# Patient Record
Sex: Male | Born: 1965 | Race: Black or African American | Hispanic: No | Marital: Single | Smoking: Current every day smoker
Health system: Southern US, Community
[De-identification: ages and names within clinical notes are randomized; demographics above are authoritative.]

## PROBLEM LIST (undated history)

## (undated) DIAGNOSIS — M75101 Unspecified rotator cuff tear or rupture of right shoulder, not specified as traumatic: Secondary | ICD-10-CM

## (undated) DIAGNOSIS — F32A Depression, unspecified: Secondary | ICD-10-CM

## (undated) DIAGNOSIS — F319 Bipolar disorder, unspecified: Secondary | ICD-10-CM

## (undated) DIAGNOSIS — I10 Essential (primary) hypertension: Secondary | ICD-10-CM

## (undated) DIAGNOSIS — F329 Major depressive disorder, single episode, unspecified: Secondary | ICD-10-CM

## (undated) DIAGNOSIS — M1990 Unspecified osteoarthritis, unspecified site: Secondary | ICD-10-CM

## (undated) DIAGNOSIS — K219 Gastro-esophageal reflux disease without esophagitis: Secondary | ICD-10-CM

## (undated) DIAGNOSIS — D573 Sickle-cell trait: Secondary | ICD-10-CM

## (undated) DIAGNOSIS — E785 Hyperlipidemia, unspecified: Secondary | ICD-10-CM

## (undated) DIAGNOSIS — J302 Other seasonal allergic rhinitis: Secondary | ICD-10-CM

## (undated) DIAGNOSIS — Z9889 Other specified postprocedural states: Secondary | ICD-10-CM

## (undated) DIAGNOSIS — G5603 Carpal tunnel syndrome, bilateral upper limbs: Secondary | ICD-10-CM

## (undated) DIAGNOSIS — R7303 Prediabetes: Secondary | ICD-10-CM

## (undated) DIAGNOSIS — J45909 Unspecified asthma, uncomplicated: Secondary | ICD-10-CM

## (undated) DIAGNOSIS — Z973 Presence of spectacles and contact lenses: Secondary | ICD-10-CM

## (undated) DIAGNOSIS — Z87898 Personal history of other specified conditions: Secondary | ICD-10-CM

## (undated) HISTORY — PX: TOOTH EXTRACTION: SUR596

## (undated) HISTORY — DX: Depression, unspecified: F32.A

## (undated) HISTORY — DX: Gastro-esophageal reflux disease without esophagitis: K21.9

## (undated) HISTORY — DX: Unspecified osteoarthritis, unspecified site: M19.90

## (undated) HISTORY — DX: Bipolar disorder, unspecified: F31.9

## (undated) HISTORY — DX: Sickle-cell trait: D57.3

## (undated) HISTORY — DX: Major depressive disorder, single episode, unspecified: F32.9

## (undated) HISTORY — DX: Other seasonal allergic rhinitis: J30.2

## (undated) HISTORY — DX: Hyperlipidemia, unspecified: E78.5

---

## 2003-12-25 HISTORY — PX: APPENDECTOMY: SHX54

## 2004-12-24 HISTORY — PX: CEREBRAL ANEURYSM REPAIR: SHX164

## 2007-05-19 ENCOUNTER — Emergency Department: Payer: Self-pay | Admitting: General Practice

## 2007-05-19 ENCOUNTER — Other Ambulatory Visit: Payer: Self-pay

## 2007-10-07 ENCOUNTER — Other Ambulatory Visit: Payer: Self-pay

## 2007-10-07 ENCOUNTER — Emergency Department: Payer: Self-pay | Admitting: Emergency Medicine

## 2008-05-31 ENCOUNTER — Emergency Department: Payer: Self-pay | Admitting: Emergency Medicine

## 2008-09-28 ENCOUNTER — Emergency Department: Payer: Self-pay | Admitting: Emergency Medicine

## 2008-09-28 ENCOUNTER — Other Ambulatory Visit: Payer: Self-pay

## 2009-11-01 ENCOUNTER — Ambulatory Visit: Payer: Self-pay | Admitting: Neurology

## 2011-12-25 HISTORY — PX: KNEE ARTHROSCOPY: SUR90

## 2011-12-25 HISTORY — PX: IRRIGATION AND DEBRIDEMENT ABSCESS: SHX5252

## 2013-07-15 ENCOUNTER — Emergency Department (INDEPENDENT_AMBULATORY_CARE_PROVIDER_SITE_OTHER)
Admission: EM | Admit: 2013-07-15 | Discharge: 2013-07-15 | Disposition: A | Discharge status: Home / Self Care | Payer: PRIVATE HEALTH INSURANCE | Source: Home / Self Care | Attending: Emergency Medicine | Admitting: Emergency Medicine

## 2013-07-15 ENCOUNTER — Encounter (HOSPITAL_COMMUNITY): Payer: Self-pay | Admitting: Emergency Medicine

## 2013-07-15 DIAGNOSIS — G5600 Carpal tunnel syndrome, unspecified upper limb: Secondary | ICD-10-CM

## 2013-07-15 DIAGNOSIS — M25569 Pain in unspecified knee: Secondary | ICD-10-CM

## 2013-07-15 DIAGNOSIS — L0390 Cellulitis, unspecified: Secondary | ICD-10-CM

## 2013-07-15 DIAGNOSIS — M25562 Pain in left knee: Secondary | ICD-10-CM

## 2013-07-15 DIAGNOSIS — L0291 Cutaneous abscess, unspecified: Secondary | ICD-10-CM

## 2013-07-15 HISTORY — DX: Unspecified asthma, uncomplicated: J45.909

## 2013-07-15 HISTORY — DX: Essential (primary) hypertension: I10

## 2013-07-15 MED ORDER — KETOROLAC TROMETHAMINE 60 MG/2ML IM SOLN
60.0000 mg | Freq: Once | INTRAMUSCULAR | Status: AC
  Administered 2013-07-15: 60 mg via INTRAMUSCULAR

## 2013-07-15 MED ORDER — KETOCONAZOLE 2 % EX CREA: TOPICAL_CREAM | Freq: Every day | CUTANEOUS | Status: DC

## 2013-07-15 MED ORDER — MUPIROCIN 2 % EX OINT: TOPICAL_OINTMENT | Freq: Three times a day (TID) | CUTANEOUS | Status: DC

## 2013-07-15 MED ORDER — KETOROLAC TROMETHAMINE 60 MG/2ML IM SOLN
INTRAMUSCULAR | Status: AC
  Filled 2013-07-15: qty 2

## 2013-07-15 MED ORDER — DOXYCYCLINE HYCLATE 100 MG PO TABS: 100.0000 mg | ORAL_TABLET | Freq: Two times a day (BID) | ORAL | Status: DC

## 2013-07-15 MED ORDER — GABAPENTIN 300 MG PO CAPS: ORAL_CAPSULE | ORAL | Status: DC

## 2013-07-15 NOTE — ED Provider Notes (Signed)
Chief Complaint:   Chief Complaint  Patient presents with  . Knee Pain    History of Present Illness:   Troy Elliott is a 47 year old male who presents today for multiple issues including left knee pain, carpal tunnel syndrome, and skin infections. The patient states his left knee pain has been going on since early this year. He underwent a surgical procedure on March 16 of this year in [place] in which a loose body was removed. It's not certain whether this was removed from the joint itself or from the bursa. The incision looks like it's over the bursa. Ever since then the knee has been painful, swollen, locks up, and sometimes gives way. He is wearing a knee brace today. Because of this pain and multiple other pains including his carpal tunnel syndrome and shoulder pain, he is in pain management in [place] and is on Percocet 5 days 325 3 per day. He comes in today requesting a refill on his Percocet since he is here in [place] taking care of a sick sister. He also has had a year-long history of carpal tunnel syndrome with pain and numbness and tingling in both hands. He was supposed to start injections in his wrists but because he came here to [facility] he did not get this done. He has wrist splints but he does not have them with him, having left them in [place]. He also has a long-standing history of a recurring skin abscesses on his legs and other areas of his body. He has been diagnosed with MRSA. As far as I can ascertain, he has never undergone a MRSA decontamination protocol. He denies any fever or chills. He has 2 lesions on his leg, one on the left lateral thigh and one near the buttock. He also complains of frequent headaches and has a history of tinea cruris. He would like something prescribed for his groin rash.   Review of Systems:  Other than noted above, the patient denies any of the following symptoms: Systemic:  No fevers, chills, sweats, or aches.  No fatigue or  tiredness. Musculoskeletal:  No joint pain, arthritis, bursitis, swelling, back pain, or neck pain.  Neurological:  No muscular weakness, paresthesias, headache, or trouble with speech or coordination.  No dizziness.  PMFSH:  Past medical history, family history, social history, meds, and allergies were reviewed.  No prior history of knee pain or arthritis.   he is allergic to Dilantin. He's on a multiplicity of medications including Cozaar, amitriptyline, 2 different diuretics, and antacid, meloxicam, and Cymbalta. He also has high blood pressure and asthma.   Physical Exam:   Vital signs:  BP 155/94  Pulse 90  Temp(Src) 98.3 F (36.8 C) (Oral)  Resp 20  SpO2 99% Gen:  Alert and oriented times 3.  In no distress. Musculoskeletal:  there is a midline scar over the left knee, this appears to be over the bursa itself and not involving the joint. This is well-healed. There is some swelling in the bursa which appears chronic. There is no redness, heat, or tenderness to palpation. The knee joint itself has a full range of motion with no pain.   McMurray's test was  negative.  Lachman's test was  negative.  Anterior drawer test was negative.   Varus and valgus stress  negative.  Otherwise, all joints had a full a ROM with no swelling, bruising or deformity.  No edema, pulses full. Extremities were warm and pink.  Capillary refill was brisk.  Exam  of the hands reveals pain to palpation over both carpal tunnels with positive Tinel and Phalen signs. Skin:  Clear, warm and dry.   He has areas of cellulitis on the left lateral thigh measuring 3 cm and near the buttock measuring 5 cm. Neuro:  Alert and oriented times 3.  Muscle strength was normal.  Sensation was intact to light touch.   Course in Urgent Care Center:    He was given Toradol 60 mg IM.  Assessment:  The primary encounter diagnosis was Knee pain, left. Diagnoses of Carpal tunnel syndrome, unspecified laterality and Cellulitis were also  pertinent to this visit.  I explained to him that we did not treat chronic pain with opioid pain medications here. I did give him a prescription for gabapentin for the pain and offered tramadol, but he declines. He needs a decontamination protocol for MRSA. I suggested twice weekly Clorox baths for 3 months and application of mupirocin ointment to the nostrils 3 times a day for one month. Finally, he was given a prescription for ketoconazole for the groin rash.  Plan:   1.  The following meds were prescribed:   New Prescriptions   DOXYCYCLINE (VIBRA-TABS) 100 MG TABLET    Take 1 tablet (100 mg total) by mouth 2 (two) times daily.   GABAPENTIN (NEURONTIN) 300 MG CAPSULE    1 daily for 3 days, 1 BID for 3 days, then 1 TID   KETOCONAZOLE (NIZORAL) 2 % CREAM    Apply topically daily.   MUPIROCIN OINTMENT (BACTROBAN) 2 %    Apply topically 3 (three) times daily.   2.  The patient was instructed in symptomatic care, including rest and activity, elevation, application of ice and compression.  Appropriate handouts were given. 3.  The patient was told to return if becoming worse in any way, if no better in 3 or 4 days, and given some red flag symptoms such as  worsening pain or fever  that would indicate earlier return.   4.  The patient was told to follow up with his primary care physician or orthopedist in [place].    Reuben Likes, MD 07/15/13 [phone]

## 2013-07-15 NOTE — ED Notes (Signed)
Pt c/o left knee pain onset 3 days... Denies inj/trauma... Hx of left knee surgery back in 3/14... Pain and swelling increases w/activity.Marland KitchenMarland KitchenKnee braces alleviates pain... Visiting from [place]; sees pain management clinic over there... Also c/o multiple abscess on left thing; tender and swelling... Denies drainage... He is alert w/no signs of acute distress.

## 2013-07-19 ENCOUNTER — Emergency Department (HOSPITAL_COMMUNITY)
Admission: EM | Admit: 2013-07-19 | Discharge: 2013-07-19 | Disposition: A | Discharge status: Home / Self Care | Payer: Medicaid - Out of State | Attending: Emergency Medicine | Admitting: Emergency Medicine

## 2013-07-19 ENCOUNTER — Encounter (HOSPITAL_COMMUNITY): Payer: Self-pay | Admitting: Emergency Medicine

## 2013-07-19 DIAGNOSIS — Z7982 Long term (current) use of aspirin: Secondary | ICD-10-CM | POA: Insufficient documentation

## 2013-07-19 DIAGNOSIS — Z791 Long term (current) use of non-steroidal anti-inflammatories (NSAID): Secondary | ICD-10-CM | POA: Insufficient documentation

## 2013-07-19 DIAGNOSIS — L03119 Cellulitis of unspecified part of limb: Secondary | ICD-10-CM | POA: Insufficient documentation

## 2013-07-19 DIAGNOSIS — F17200 Nicotine dependence, unspecified, uncomplicated: Secondary | ICD-10-CM | POA: Diagnosis not present

## 2013-07-19 DIAGNOSIS — L02419 Cutaneous abscess of limb, unspecified: Secondary | ICD-10-CM | POA: Diagnosis not present

## 2013-07-19 DIAGNOSIS — J45909 Unspecified asthma, uncomplicated: Secondary | ICD-10-CM | POA: Diagnosis not present

## 2013-07-19 DIAGNOSIS — L02416 Cutaneous abscess of left lower limb: Secondary | ICD-10-CM

## 2013-07-19 DIAGNOSIS — Z79899 Other long term (current) drug therapy: Secondary | ICD-10-CM | POA: Diagnosis not present

## 2013-07-19 DIAGNOSIS — I10 Essential (primary) hypertension: Secondary | ICD-10-CM | POA: Insufficient documentation

## 2013-07-19 MED ORDER — SULFAMETHOXAZOLE-TRIMETHOPRIM 800-160 MG PO TABS: 1.0000 | ORAL_TABLET | Freq: Two times a day (BID) | ORAL | Status: DC

## 2013-07-19 MED ORDER — OXYCODONE-ACETAMINOPHEN 5-325 MG PO TABS: 2.0000 | ORAL_TABLET | ORAL | Status: DC | PRN

## 2013-07-19 MED ORDER — OXYCODONE-ACETAMINOPHEN 5-325 MG PO TABS
2.0000 | ORAL_TABLET | Freq: Once | ORAL | Status: AC
  Administered 2013-07-19: 2 via ORAL
  Filled 2013-07-19: qty 2

## 2013-07-19 NOTE — ED Provider Notes (Signed)
This chart was scribed for non-physician practitioner Trisha Mangle, PA-C working with Derwood Kaplan, MD, by Candelaria Stagers, ED Scribe. This patient was seen in room TR09C/TR09C and the patient's care was started at 4:23 PM  CSN: [id number]     Arrival date & time 07/19/13  1332 History     First MD Initiated Contact with Patient 07/19/13 1619     Chief Complaint  Patient presents with  . Abscess    The history is provided by the patient. No language interpreter was used.   HPI Comments: Troy Elliott is a 47 y.o. male who presents to the Emergency Department complaining of mutliple abscess to the left leg and buttocks that started several days ago which are gradually worsening.  Pt reports he has h/o of abscesses with his last one three weeks ago to the buttocks.  Pt denies ill contacts.  He reports MRSA infection in the past.  Pt has finished the antibiotics for the last abscsess.     Past Medical History  Diagnosis Date  . Hypertension   . Asthma    Past Surgical History  Procedure Laterality Date  . Knee surgery Left    No family history on file. History  Substance Use Topics  . Smoking status: Current Every Day Smoker  . Smokeless tobacco: Not on file  . Alcohol Use: Yes    Review of Systems  Skin:       multiple abscesses to left thigh and buttocks.   All other systems reviewed and are negative.    Allergies  Dilantin  Home Medications   Current Outpatient Rx  Name  Route  Sig  Dispense  Refill  . Alum & Mag Hydroxide-Simeth (ANTACID & ANTIGAS PO)   Oral   Take 1 tablet by mouth 2 (two) times daily.         Marland Kitchen amLODipine (NORVASC) 5 MG tablet   Oral   Take 5 mg by mouth daily.         Marland Kitchen aspirin EC 81 MG tablet   Oral   Take 81 mg by mouth daily.         Marland Kitchen atorvastatin (LIPITOR) 10 MG tablet   Oral   Take 10 mg by mouth daily.         . DULoxetine (CYMBALTA) 30 MG capsule   Oral   Take 30 mg by mouth daily.         .  hydrochlorothiazide (HYDRODIURIL) 25 MG tablet   Oral   Take 25 mg by mouth daily.         Marland Kitchen losartan (COZAAR) 50 MG tablet   Oral   Take 50 mg by mouth 2 (two) times daily.         . meloxicam (MOBIC) 15 MG tablet   Oral   Take 15 mg by mouth daily.         Marland Kitchen oxyCODONE (OXY IR/ROXICODONE) 5 MG immediate release tablet   Oral   Take 5 mg by mouth 2 (two) times daily.         . ranitidine (ZANTAC) 150 MG tablet   Oral   Take 150 mg by mouth 2 (two) times daily.          BP 161/98  Pulse 74  Temp(Src) 98.3 F (36.8 C) (Oral)  SpO2 100% Physical Exam  Nursing note and vitals reviewed. Constitutional: He is oriented to person, place, and time. He appears well-developed and well-nourished. No distress.  HENT:  Head: Normocephalic and atraumatic.  Eyes: EOM are normal.  Neck: Neck supple. No tracheal deviation present.  Cardiovascular: Normal rate.   Pulmonary/Chest: Effort normal. No respiratory distress.  Musculoskeletal: Normal range of motion.  Neurological: He is alert and oriented to person, place, and time.  Skin: Skin is warm and dry.  Multiple raised red areas with center pustules on left leg, left thigh, and buttocks.   Psychiatric: He has a normal mood and affect. His behavior is normal.    ED Course   Procedures  DIAGNOSTIC STUDIES: Oxygen Saturation is 100% on room air, normal by my interpretation.    COORDINATION OF CARE:  4:25 PM Discussed course of care with pt which includes antibiotics and pain medication.  Pt understands and agrees.   Labs Reviewed - No data to display No results found. 1. Abscess of left leg     MDM   I personally performed the services in this documentation, which was scribed in my presence.  The recorded information has been reviewed and considered.   Barnet Pall.   Lonia Skinner Manti, PA-C 07/19/13 1659

## 2013-07-19 NOTE — ED Notes (Signed)
Pt. Stated, i have multiple abscesses on my legs.

## 2013-07-20 NOTE — ED Provider Notes (Signed)
Medical screening examination/treatment/procedure(s) were performed by non-physician practitioner and as supervising physician I was immediately available for consultation/collaboration.  Ezekiel Menzer, MD 07/20/13 1544 

## 2013-09-12 ENCOUNTER — Encounter (HOSPITAL_COMMUNITY): Payer: Self-pay | Admitting: Emergency Medicine

## 2013-09-12 ENCOUNTER — Emergency Department (HOSPITAL_COMMUNITY): Payer: Medicaid - Out of State

## 2013-09-12 ENCOUNTER — Emergency Department (HOSPITAL_COMMUNITY)
Admission: EM | Admit: 2013-09-12 | Discharge: 2013-09-12 | Disposition: A | Discharge status: Home / Self Care | Payer: Medicaid - Out of State | Attending: Emergency Medicine | Admitting: Emergency Medicine

## 2013-09-12 DIAGNOSIS — S62609A Fracture of unspecified phalanx of unspecified finger, initial encounter for closed fracture: Secondary | ICD-10-CM | POA: Insufficient documentation

## 2013-09-12 DIAGNOSIS — Y929 Unspecified place or not applicable: Secondary | ICD-10-CM | POA: Diagnosis not present

## 2013-09-12 DIAGNOSIS — Z7982 Long term (current) use of aspirin: Secondary | ICD-10-CM | POA: Insufficient documentation

## 2013-09-12 DIAGNOSIS — R509 Fever, unspecified: Secondary | ICD-10-CM | POA: Diagnosis not present

## 2013-09-12 DIAGNOSIS — S62502A Fracture of unspecified phalanx of left thumb, initial encounter for closed fracture: Secondary | ICD-10-CM

## 2013-09-12 DIAGNOSIS — F17200 Nicotine dependence, unspecified, uncomplicated: Secondary | ICD-10-CM | POA: Diagnosis not present

## 2013-09-12 DIAGNOSIS — W010XXA Fall on same level from slipping, tripping and stumbling without subsequent striking against object, initial encounter: Secondary | ICD-10-CM | POA: Diagnosis not present

## 2013-09-12 DIAGNOSIS — Z79899 Other long term (current) drug therapy: Secondary | ICD-10-CM | POA: Insufficient documentation

## 2013-09-12 DIAGNOSIS — N63 Unspecified lump in unspecified breast: Secondary | ICD-10-CM | POA: Diagnosis not present

## 2013-09-12 DIAGNOSIS — K044 Acute apical periodontitis of pulpal origin: Secondary | ICD-10-CM | POA: Insufficient documentation

## 2013-09-12 DIAGNOSIS — K047 Periapical abscess without sinus: Secondary | ICD-10-CM

## 2013-09-12 DIAGNOSIS — K089 Disorder of teeth and supporting structures, unspecified: Secondary | ICD-10-CM | POA: Diagnosis present

## 2013-09-12 DIAGNOSIS — Y9389 Activity, other specified: Secondary | ICD-10-CM | POA: Insufficient documentation

## 2013-09-12 DIAGNOSIS — I10 Essential (primary) hypertension: Secondary | ICD-10-CM | POA: Diagnosis not present

## 2013-09-12 DIAGNOSIS — J45909 Unspecified asthma, uncomplicated: Secondary | ICD-10-CM | POA: Insufficient documentation

## 2013-09-12 MED ORDER — PENICILLIN V POTASSIUM 500 MG PO TABS: 500.0000 mg | ORAL_TABLET | Freq: Three times a day (TID) | ORAL | Status: DC

## 2013-09-12 MED ORDER — IBUPROFEN 600 MG PO TABS: 600.0000 mg | ORAL_TABLET | Freq: Four times a day (QID) | ORAL | Status: DC | PRN

## 2013-09-12 MED ORDER — HYDROCODONE-ACETAMINOPHEN 5-325 MG PO TABS
1.0000 | ORAL_TABLET | Freq: Once | ORAL | Status: AC
  Administered 2013-09-12: 1 via ORAL
  Filled 2013-09-12: qty 1

## 2013-09-12 MED ORDER — HYDROCODONE-ACETAMINOPHEN 5-325 MG PO TABS: ORAL_TABLET | ORAL | Status: DC

## 2013-09-12 NOTE — ED Provider Notes (Signed)
CSN: [id number]     Arrival date & time 09/12/13  1331 History  This chart was scribed for non-physician practitioner Rhea Bleacher PA-C working with Roney Marion, MD by Leone Payor, ED Scribe. This patient was seen in room TR07C/TR07C and the patient's care was started at 1331.    Chief Complaint  Patient presents with  . Dental Pain  . Abscess    The history is provided by the patient. No language interpreter was used.    HPI Comments: Troy Elliott is a 47 y.o. male who presents to the Emergency Department complaining of 2 days of gradual onset, gradually worsening, constant right upper dental pain. Pt states he had associated subjective fever and chills at night. He denies trouble swallowing.   Pt also complains of a history of abscesses and has a constant, unchanged lump to the left chest that occurred a few weeks ago. He also complains of left thumb pain that began yesterday. Pt states he tripped causing him to break his fall with that hand.   Pt is from [place] and will return there in 1 week. He has a PCP there he can follow up with.   No treatments for these PTA. The onset of this condition was acute. The course is constant. Aggravating factors: none. Alleviating factors: none.    Past Medical History  Diagnosis Date  . Hypertension   . Asthma    Past Surgical History  Procedure Laterality Date  . Knee surgery Left    No family history on file. History  Substance Use Topics  . Smoking status: Current Every Day Smoker  . Smokeless tobacco: Not on file  . Alcohol Use: Yes    Review of Systems  Constitutional: Positive for fever and chills. Negative for activity change.  HENT: Positive for dental problem. Negative for ear pain, sore throat, facial swelling, trouble swallowing and neck pain.   Eyes: Negative for discharge.  Respiratory: Negative for shortness of breath and stridor.   Gastrointestinal: Negative for rectal pain.  Genitourinary: Negative for dysuria.   Musculoskeletal: Positive for arthralgias (left thumb pain). Negative for back pain, joint swelling and gait problem.  Skin: Negative for color change, rash and wound.       Positive for skin lump  Neurological: Negative for weakness, numbness and headaches.  Hematological: Negative for adenopathy.    Allergies  Dilantin  Home Medications   Current Outpatient Rx  Name  Route  Sig  Dispense  Refill  . Alum & Mag Hydroxide-Simeth (ANTACID & ANTIGAS PO)   Oral   Take 1 tablet by mouth 2 (two) times daily.         Marland Kitchen amLODipine (NORVASC) 5 MG tablet   Oral   Take 5 mg by mouth daily.         Marland Kitchen aspirin EC 81 MG tablet   Oral   Take 81 mg by mouth daily.         Marland Kitchen atorvastatin (LIPITOR) 10 MG tablet   Oral   Take 10 mg by mouth daily.         . DULoxetine (CYMBALTA) 30 MG capsule   Oral   Take 30 mg by mouth daily.         . hydrochlorothiazide (HYDRODIURIL) 25 MG tablet   Oral   Take 25 mg by mouth daily.         Marland Kitchen losartan (COZAAR) 50 MG tablet   Oral   Take 50 mg by mouth  2 (two) times daily.         . meloxicam (MOBIC) 15 MG tablet   Oral   Take 15 mg by mouth daily.         Marland Kitchen oxyCODONE (OXY IR/ROXICODONE) 5 MG immediate release tablet   Oral   Take 5 mg by mouth 2 (two) times daily.         Marland Kitchen oxyCODONE-acetaminophen (PERCOCET/ROXICET) 5-325 MG per tablet   Oral   Take 2 tablets by mouth every 4 (four) hours as needed for pain.   6 tablet   0   . ranitidine (ZANTAC) 150 MG tablet   Oral   Take 150 mg by mouth 2 (two) times daily.         Marland Kitchen sulfamethoxazole-trimethoprim (SEPTRA DS) 800-160 MG per tablet   Oral   Take 1 tablet by mouth 2 (two) times daily.   28 tablet   0    BP 161/101  Pulse 82  Temp(Src) 99.3 F (37.4 C) (Oral)  Resp 16  SpO2 98% Physical Exam  Nursing note and vitals reviewed. Constitutional: He is oriented to person, place, and time. He appears well-developed and well-nourished. No distress.  HENT:   Head: Macrocephalic.  Mouth/Throat: Uvula is midline, oropharynx is clear and moist and mucous membranes are normal.  Right 2nd maxillary incisor tenderness.   Eyes: Conjunctivae and EOM are normal.  Cardiovascular: Normal rate.   Pulmonary/Chest: Effort normal. No stridor.  Musculoskeletal: Normal range of motion. He exhibits tenderness.  Generalized tenderness of left thumb. Limited ROM of the IP joint.  Neurological: He is alert and oriented to person, place, and time.  Skin:  Pea sized, firm nodule that is painless. Located 1 cm superior to the nipple.   Psychiatric: He has a normal mood and affect.    ED Course  Procedures  DIAGNOSTIC STUDIES: Oxygen Saturation is 98% on RA, normal by my interpretation.    COORDINATION OF CARE: 2:00 PM Discussed treatment plan with pt at bedside and pt agreed to plan.   Labs Review Labs Reviewed - No data to display Imaging Review Dg Finger Thumb Left  09/12/2013   CLINICAL DATA:  Fall, swelling/bruising along left thumb  EXAM: LEFT THUMB 2+V  COMPARISON:  None.  FINDINGS: Suspected tiny nondisplaced fracture along the volar base of the 1st distal phalanx.  Degenerative changes of the MCP and IP joint.  Mild soft tissue swelling.  IMPRESSION: Suspected tiny nondisplaced fracture along the volar base of the 1st distal phalanx.   Electronically Signed   By: Charline Bills M.D.   On: 09/12/2013 14:54   Vital signs reviewed and are as follows: Filed Vitals:   09/12/13 1344  BP: 161/101  Pulse: 82  Temp: 99.3 F (37.4 C)  Resp: 16   Pain medication ordered.   Patient counseled to take prescribed medications as directed, return with worsening facial or neck swelling, and to follow-up with their dentist as soon as possible.   Patient counseled on use of narcotic pain medications. Counseled not to combine these medications with others containing tylenol. Urged not to drink alcohol, drive, or perform any other activities that requires focus  while taking these medications. The patient verbalizes understanding and agrees with the plan.  Thumb spica given by nurse. Pt has ortho in [place] he sees for his knee who he can see re: thumb fracture.   Patient will f/u re: breast lump. He understands he needs further eval to r/o breast CA and  will follow-up in the next few weeks.   Patient urged to return with worsening symptoms or other concerns. Patient verbalized understanding and agrees with plan.    MDM   1. Dental infection   2. Fracture of phalanx of thumb, left, closed, initial encounter   3. Breast lump or mass    Patient with toothache.  No gross abscess but there is redness suggesting infection.  Exam unconcerning for Ludwig's angina or other deep tissue infection in neck.  Will treat with penicillin and pain medicine.  Urged patient to follow-up with dentist.    Thumb fracture: non-displaced. Splinting and ortho f/u indicated. Pt has ortho [place] in [place] and is returning there in next week. Urged f/u. Do not suspect nerve, tendon, or vascular injury.   Breast lump: Does not appear infectious, does not feel cystic. Given firm, painless nature, pt will likely need mammography to eval for CA. Patient will be best able to do this by PCP in [place] when he returns. Patient understands need for eval.     I personally performed the services described in this documentation, which was scribed in my presence. The recorded information has been reviewed and is accurate.   Renne Crigler, PA-C 09/12/13 1520

## 2013-09-12 NOTE — ED Notes (Signed)
Pt. Stated, I have a toothache for a couple of days.  I also have some boils that keep popping up.

## 2013-09-16 NOTE — ED Provider Notes (Signed)
Medical screening examination/treatment/procedure(s) were performed by non-physician practitioner and as supervising physician I was immediately available for consultation/collaboration.   Roney Marion, MD 09/16/13 [phone]

## 2014-06-16 ENCOUNTER — Encounter (HOSPITAL_COMMUNITY): Payer: Self-pay | Admitting: Emergency Medicine

## 2014-06-16 ENCOUNTER — Emergency Department (HOSPITAL_COMMUNITY)
Admission: EM | Admit: 2014-06-16 | Discharge: 2014-06-16 | Disposition: A | Discharge status: Home / Self Care | Payer: PRIVATE HEALTH INSURANCE | Attending: Emergency Medicine | Admitting: Emergency Medicine

## 2014-06-16 ENCOUNTER — Emergency Department (HOSPITAL_COMMUNITY): Payer: PRIVATE HEALTH INSURANCE

## 2014-06-16 DIAGNOSIS — Y9389 Activity, other specified: Secondary | ICD-10-CM | POA: Insufficient documentation

## 2014-06-16 DIAGNOSIS — Y929 Unspecified place or not applicable: Secondary | ICD-10-CM | POA: Insufficient documentation

## 2014-06-16 DIAGNOSIS — S6390XA Sprain of unspecified part of unspecified wrist and hand, initial encounter: Secondary | ICD-10-CM | POA: Insufficient documentation

## 2014-06-16 DIAGNOSIS — J45909 Unspecified asthma, uncomplicated: Secondary | ICD-10-CM | POA: Insufficient documentation

## 2014-06-16 DIAGNOSIS — Z791 Long term (current) use of non-steroidal anti-inflammatories (NSAID): Secondary | ICD-10-CM | POA: Insufficient documentation

## 2014-06-16 DIAGNOSIS — S63602A Unspecified sprain of left thumb, initial encounter: Secondary | ICD-10-CM

## 2014-06-16 DIAGNOSIS — G8929 Other chronic pain: Secondary | ICD-10-CM

## 2014-06-16 DIAGNOSIS — Z760 Encounter for issue of repeat prescription: Secondary | ICD-10-CM | POA: Insufficient documentation

## 2014-06-16 DIAGNOSIS — I10 Essential (primary) hypertension: Secondary | ICD-10-CM | POA: Insufficient documentation

## 2014-06-16 DIAGNOSIS — Z79899 Other long term (current) drug therapy: Secondary | ICD-10-CM | POA: Insufficient documentation

## 2014-06-16 DIAGNOSIS — F17200 Nicotine dependence, unspecified, uncomplicated: Secondary | ICD-10-CM | POA: Insufficient documentation

## 2014-06-16 DIAGNOSIS — IMO0002 Reserved for concepts with insufficient information to code with codable children: Secondary | ICD-10-CM | POA: Insufficient documentation

## 2014-06-16 MED ORDER — OXYCODONE HCL 5 MG PO TABS: 5.0000 mg | ORAL_TABLET | Freq: Four times a day (QID) | ORAL | Status: DC | PRN

## 2014-06-16 MED ORDER — OXYCODONE-ACETAMINOPHEN 5-325 MG PO TABS
1.0000 | ORAL_TABLET | Freq: Once | ORAL | Status: AC
  Administered 2014-06-16: 1 via ORAL
  Filled 2014-06-16: qty 1

## 2014-06-16 NOTE — ED Notes (Signed)
Pt verbalized understanding of no driving within 4 hours of taking

## 2014-06-16 NOTE — ED Notes (Signed)
Chronic pain to left knee, left thumb, and left shoulder - out of pain meds x 1 wk.

## 2014-06-16 NOTE — Discharge Instructions (Signed)
Chronic Pain Chronic pain can be defined as pain that is off and on and lasts for 3-6 months or longer. Many things cause chronic pain, which can make it difficult to make a diagnosis. There are many treatment options available for chronic pain. However, finding a treatment that works well for you may require trying various approaches until the right one is found. Many people benefit from a combination of two or more types of treatment to control their pain. SYMPTOMS  Chronic pain can occur anywhere in the body and can range from mild to very severe. Some types of chronic pain include:  Headache.  Low back pain.  Cancer pain.  Arthritis pain.  Neurogenic pain. This is pain resulting from damage to nerves. People with chronic pain may also have other symptoms such as:  Depression.  Anger.  Insomnia.  Anxiety. DIAGNOSIS  Your health care provider will help diagnose your condition over time. In many cases, the initial focus will be on excluding possible conditions that could be causing the pain. Depending on your symptoms, your health care provider may order tests to diagnose your condition. Some of these tests may include:   Blood tests.   CT scan.   MRI.   X-rays.   Ultrasounds.   Nerve conduction studies.  You may need to see a specialist.  TREATMENT  Finding treatment that works well may take time. You may be referred to a pain specialist. He or she may prescribe medicine or therapies, such as:   Mindful meditation or yoga.  Shots (injections) of numbing or pain-relieving medicines into the spine or area of pain.  Local electrical stimulation.  Acupuncture.   Massage therapy.   Aroma, color, light, or sound therapy.   Biofeedback.   Working with a physical therapist to keep from getting stiff.   Regular, gentle exercise.   Cognitive or behavioral therapy.   Group support.  Sometimes, surgery may be recommended.  HOME CARE INSTRUCTIONS    Take all medicines as directed by your health care provider.   Lessen stress in your life by relaxing and doing things such as listening to calming music.   Exercise or be active as directed by your health care provider.   Eat a healthy diet and include things such as vegetables, fruits, fish, and lean meats in your diet.   Keep all follow-up appointments with your health care provider.   Attend a support group with others suffering from chronic pain. SEEK MEDICAL CARE IF:   Your pain gets worse.   You develop a new pain that was not there before.   You cannot tolerate medicines given to you by your health care provider.   You have new symptoms since your last visit with your health care provider.  SEEK IMMEDIATE MEDICAL CARE IF:   You feel weak.   You have decreased sensation or numbness.   You lose control of bowel or bladder function.   Your pain suddenly gets much worse.   You develop shaking.  You develop chills.  You develop confusion.  You develop chest pain.  You develop shortness of breath.  MAKE SURE YOU:  Understand these instructions.  Will watch your condition.  Will get help right away if you are not doing well or get worse. Document Released: 09/01/2002 Document Revised: 08/12/2013 Document Reviewed: 06/05/2013 [facility] Patient Information 2015 [place], [place]. This information is not intended to replace advice given to you by your health care provider. Make sure you discuss any  questions you have with your health care provider.  Finger Sprain A finger sprain happens when the bands of tissue that hold the finger bones together (ligaments) stretch too much and tear. HOME CARE  Keep your injured finger raised (elevated) when possible.  Put ice on the injured area, twice a day, for 2 to 3 days.  Put ice in a plastic bag.  Place a towel between your skin and the bag.  Leave the ice on for 15 minutes.  Only take medicine as  told by your doctor.  Do not wear rings on the injured finger.  Protect your finger until pain and stiffness go away (usually 3 to 4 weeks).  Do not get your cast or splint to get wet. Cover your cast or splint with a plastic bag when you shower or bathe. Do not swim.  Your doctor may suggest special exercises for you to do. These exercises will help keep or stop stiffness from happening. GET HELP RIGHT AWAY IF:  Your cast or splint gets damaged.  Your pain gets worse, not better. MAKE SURE YOU:  Understand these instructions.  Will watch your condition.  Will get help right away if you are not doing well or get worse. Document Released: 01/12/2011 Document Revised: 03/03/2012 Document Reviewed: 08/13/2011 [facility] Patient Information 2015 [place]. This information is not intended to replace advice given to you by your health care provider. Make sure you discuss any questions you have with your health care provider.   You may take the oxycodone prescribed for pain relief.  This will make you drowsy - do not drive within 4 hours of taking this medication.
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
## 2014-06-18 NOTE — ED Provider Notes (Signed)
Medical screening examination/treatment/procedure(s) were performed by non-physician practitioner and as supervising physician I was immediately available for consultation/collaboration.   EKG Interpretation None       Donnetta HutchingBrian Cook, MD 06/18/14 [phone]

## 2015-11-03 ENCOUNTER — Ambulatory Visit (INDEPENDENT_AMBULATORY_CARE_PROVIDER_SITE_OTHER): Payer: PRIVATE HEALTH INSURANCE | Admitting: Family Medicine

## 2015-11-03 ENCOUNTER — Encounter: Payer: Self-pay | Admitting: Family Medicine

## 2015-11-03 VITALS — BP 168/92 | HR 101 | Temp 98.3°F | Resp 16 | Ht 71.0 in | Wt 192.0 lb

## 2015-11-03 DIAGNOSIS — J4520 Mild intermittent asthma, uncomplicated: Secondary | ICD-10-CM | POA: Diagnosis not present

## 2015-11-03 DIAGNOSIS — J069 Acute upper respiratory infection, unspecified: Secondary | ICD-10-CM | POA: Diagnosis not present

## 2015-11-03 DIAGNOSIS — I10 Essential (primary) hypertension: Secondary | ICD-10-CM | POA: Diagnosis not present

## 2015-11-03 DIAGNOSIS — G8929 Other chronic pain: Secondary | ICD-10-CM

## 2015-11-03 DIAGNOSIS — R059 Cough, unspecified: Secondary | ICD-10-CM

## 2015-11-03 DIAGNOSIS — R05 Cough: Secondary | ICD-10-CM

## 2015-11-03 DIAGNOSIS — H04209 Unspecified epiphora, unspecified lacrimal gland: Secondary | ICD-10-CM

## 2015-11-03 DIAGNOSIS — Z87828 Personal history of other (healed) physical injury and trauma: Secondary | ICD-10-CM

## 2015-11-03 DIAGNOSIS — R067 Sneezing: Secondary | ICD-10-CM

## 2015-11-03 DIAGNOSIS — R6883 Chills (without fever): Secondary | ICD-10-CM

## 2015-11-03 DIAGNOSIS — Z202 Contact with and (suspected) exposure to infections with a predominantly sexual mode of transmission: Secondary | ICD-10-CM

## 2015-11-03 DIAGNOSIS — F17200 Nicotine dependence, unspecified, uncomplicated: Secondary | ICD-10-CM | POA: Insufficient documentation

## 2015-11-03 LAB — CBC WITH DIFFERENTIAL/PLATELET
BASOS ABS: 0 10*3/uL (ref 0.0–0.1)
Basophils Relative: 0 % (ref 0–1)
EOS PCT: 3 % (ref 0–5)
Eosinophils Absolute: 0.2 10*3/uL (ref 0.0–0.7)
HCT: 47 % (ref 39.0–52.0)
Hemoglobin: 16.2 g/dL (ref 13.0–17.0)
Lymphocytes Relative: 26 % (ref 12–46)
Lymphs Abs: 1.6 10*3/uL (ref 0.7–4.0)
MCH: 32.3 pg (ref 26.0–34.0)
MCHC: 34.5 g/dL (ref 30.0–36.0)
MCV: 93.8 fL (ref 78.0–100.0)
MONO ABS: 0.7 10*3/uL (ref 0.1–1.0)
MPV: 9.3 fL (ref 8.6–12.4)
Monocytes Relative: 11 % (ref 3–12)
Neutro Abs: 3.7 10*3/uL (ref 1.7–7.7)
Neutrophils Relative %: 60 % (ref 43–77)
Platelets: 224 10*3/uL (ref 150–400)
RBC: 5.01 MIL/uL (ref 4.22–5.81)
RDW: 13.2 % (ref 11.5–15.5)
WBC: 6.1 10*3/uL (ref 4.0–10.5)

## 2015-11-03 LAB — COMPLETE METABOLIC PANEL WITH GFR
ALT: 37 U/L (ref 9–46)
AST: 34 U/L (ref 10–35)
Albumin: 4.6 g/dL (ref 3.6–5.1)
Alkaline Phosphatase: 85 U/L (ref 40–115)
BILIRUBIN TOTAL: 0.4 mg/dL (ref 0.2–1.2)
BUN: 11 mg/dL (ref 7–25)
CO2: 28 mmol/L (ref 20–31)
CREATININE: 0.99 mg/dL (ref 0.70–1.33)
Calcium: 10.5 mg/dL — ABNORMAL HIGH (ref 8.6–10.3)
Chloride: 99 mmol/L (ref 98–110)
GFR, EST NON AFRICAN AMERICAN: 88 mL/min (ref >=60)
Glucose, Bld: 87 mg/dL (ref 65–99)
Potassium: 4.5 mmol/L (ref 3.5–5.3)
SODIUM: 138 mmol/L (ref 135–146)
Total Protein: 7.8 g/dL (ref 6.1–8.1)

## 2015-11-03 LAB — POCT URINALYSIS DIP (DEVICE)
Bilirubin Urine: NEGATIVE
Glucose, UA: NEGATIVE mg/dL
HGB URINE DIPSTICK: NEGATIVE
Ketones, ur: NEGATIVE mg/dL
Leukocytes, UA: NEGATIVE
Nitrite: NEGATIVE
PROTEIN: NEGATIVE mg/dL
UROBILINOGEN UA: 0.2 mg/dL (ref 0.0–1.0)
pH: 5.5 (ref 5.0–8.0)

## 2015-11-03 LAB — POCT INFLUENZA A/B
Influenza A, POC: NEGATIVE
Influenza B, POC: NEGATIVE

## 2015-11-03 MED ORDER — LOSARTAN POTASSIUM 50 MG PO TABS: 50.0000 mg | ORAL_TABLET | Freq: Two times a day (BID) | ORAL | Status: DC

## 2015-11-03 MED ORDER — FLUTICASONE PROPIONATE HFA 110 MCG/ACT IN AERO: 1.0000 | INHALATION_SPRAY | Freq: Two times a day (BID) | RESPIRATORY_TRACT | Status: DC

## 2015-11-03 MED ORDER — MONTELUKAST SODIUM 10 MG PO TABS: 10.0000 mg | ORAL_TABLET | Freq: Every day | ORAL | Status: DC

## 2015-11-03 MED ORDER — ALBUTEROL SULFATE HFA 108 (90 BASE) MCG/ACT IN AERS: 2.0000 | INHALATION_SPRAY | Freq: Four times a day (QID) | RESPIRATORY_TRACT | Status: DC | PRN

## 2015-11-03 MED ORDER — CLONIDINE HCL 0.1 MG PO TABS
0.2000 mg | ORAL_TABLET | Freq: Once | ORAL | Status: AC
  Administered 2015-11-03: 0.2 mg via ORAL

## 2015-11-03 MED ORDER — AMLODIPINE BESYLATE 5 MG PO TABS
5.0000 mg | ORAL_TABLET | Freq: Every day | ORAL | Status: DC
Start: 2015-11-03 — End: 2015-11-03

## 2015-11-03 MED ORDER — AMLODIPINE BESYLATE 5 MG PO TABS
5.0000 mg | ORAL_TABLET | Freq: Every day | ORAL | Status: DC
Start: 2015-11-03 — End: 2015-11-16

## 2015-11-03 MED ORDER — CHLORPHENIRAMINE-DM 4-30 MG PO TABS: 1.0000 | ORAL_TABLET | Freq: Four times a day (QID) | ORAL | Status: DC | PRN
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
## 2015-11-03 NOTE — Patient Instructions (Addendum)
Will start Amlodipine 5 mg daily and Cozaar 50 mg twice daily for uncontrollable blood pressure.  Recommend a low fat, low sodium diet.  Increase exercise to 30 minutes 3 times per week   Will send a referral to pain management  Will notify by phone with laboratory results   DASH Eating Plan DASH stands for "Dietary Approaches to Stop Hypertension." The DASH eating plan is a healthy eating plan that has been shown to reduce high blood pressure (hypertension). Additional health benefits may include reducing the risk of type 2 diabetes mellitus, heart disease, and stroke. The DASH eating plan may also help with weight loss. WHAT DO I NEED TO KNOW ABOUT THE DASH EATING PLAN? For the DASH eating plan, you will follow these general guidelines:  Choose foods with a percent daily value for sodium of less than 5% (as listed on the food label).  Use salt-free seasonings or herbs instead of table salt or sea salt.  Check with your health care provider or pharmacist before using salt substitutes.  Eat lower-sodium products, often labeled as "lower sodium" or "no salt added."  Eat fresh foods.  Eat more vegetables, fruits, and low-fat dairy products.  Choose whole grains. Look for the word "whole" as the first word in the ingredient list.  Choose fish and skinless chicken or [place] more often than red meat. Limit fish, poultry, and meat to 6 oz (170 g) each day.  Limit sweets, desserts, sugars, and sugary drinks.  Choose heart-healthy fats.  Limit cheese to 1 oz (28 g) per day.  Eat more home-cooked food and less restaurant, buffet, and fast food.  Limit fried foods.  Cook foods using methods other than frying.  Limit canned vegetables. If you do use them, rinse them well to decrease the sodium.  When eating at a restaurant, ask that your food be prepared with less salt, or no salt if possible. WHAT FOODS CAN I EAT? Seek help from a dietitian for individual calorie  needs. Grains Whole grain or whole wheat bread. Brown rice. Whole grain or whole wheat pasta. Quinoa, bulgur, and whole grain cereals. Low-sodium cereals. Corn or whole wheat flour tortillas. Whole grain cornbread. Whole grain crackers. Low-sodium crackers. Vegetables Fresh or frozen vegetables (raw, steamed, roasted, or grilled). Low-sodium or reduced-sodium tomato and vegetable juices. Low-sodium or reduced-sodium tomato sauce and paste. Low-sodium or reduced-sodium canned vegetables.  Fruits All fresh, canned (in natural juice), or frozen fruits. Meat and Other Protein Products Ground beef (85% or leaner), grass-fed beef, or beef trimmed of fat. Skinless chicken or [place]. Ground chicken or [place]. Pork trimmed of fat. All fish and seafood. Eggs. Dried beans, peas, or lentils. Unsalted nuts and seeds. Unsalted canned beans. Dairy Low-fat dairy products, such as skim or 1% milk, 2% or reduced-fat cheeses, low-fat ricotta or cottage cheese, or plain low-fat yogurt. Low-sodium or reduced-sodium cheeses. Fats and Oils Tub margarines without trans fats. Light or reduced-fat mayonnaise and salad dressings (reduced sodium). Avocado. Safflower, olive, or canola oils. Natural peanut or almond butter. Other Unsalted popcorn and pretzels. The items listed above may not be a complete list of recommended foods or beverages. Contact your dietitian for more options. WHAT FOODS ARE NOT RECOMMENDED? Grains White bread. White pasta. White rice. Refined cornbread. Bagels and croissants. Crackers that contain trans fat. Vegetables Creamed or fried vegetables. Vegetables in a cheese sauce. Regular canned vegetables. Regular canned tomato sauce and paste. Regular tomato and vegetable juices. Fruits Dried fruits. Canned fruit in light  or heavy syrup. Fruit juice. Meat and Other Protein Products Fatty cuts of meat. Ribs, chicken wings, bacon, sausage, bologna, salami, chitterlings, fatback, hot dogs, bratwurst,  and packaged luncheon meats. Salted nuts and seeds. Canned beans with salt. Dairy Whole or 2% milk, cream, half-and-half, and cream cheese. Whole-fat or sweetened yogurt. Full-fat cheeses or blue cheese. Nondairy creamers and whipped toppings. Processed cheese, cheese spreads, or cheese curds. Condiments Onion and garlic salt, seasoned salt, table salt, and sea salt. Canned and packaged gravies. Worcestershire sauce. Tartar sauce. Barbecue sauce. Teriyaki sauce. Soy sauce, including reduced sodium. Steak sauce. Fish sauce. Oyster sauce. Cocktail sauce. Horseradish. Ketchup and mustard. Meat flavorings and tenderizers. Bouillon cubes. Hot sauce. Tabasco sauce. Marinades. Taco seasonings. Relishes. Fats and Oils Butter, stick margarine, lard, shortening, ghee, and bacon fat. Coconut, palm kernel, or palm oils. Regular salad dressings. Other Pickles and olives. Salted popcorn and pretzels. The items listed above may not be a complete list of foods and beverages to avoid. Contact your dietitian for more information. WHERE CAN I FIND MORE INFORMATION? National Heart, Lung, and Blood Institute: [employer].itwww.nhlbi.nih.gov/health/health-topics/topics/dash/   This information is not intended to replace advice given to you by your health care provider. Make sure you discuss any questions you have with your health care provider.   Document Released: 11/29/2011 Document Revised: 12/31/2014 Document Reviewed: 10/14/2013 Elsevier Interactive Patient Education 2016 [employer] Hypertension Hypertension, commonly called high blood pressure, is when the force of blood pumping through your arteries is too strong. Your arteries are the blood vessels that carry blood from your heart throughout your body. A blood pressure reading consists of a higher number over a lower number, such as 110/72. The higher number (systolic) is the pressure inside your arteries when your heart pumps. The lower number (diastolic) is the pressure  inside your arteries when your heart relaxes. Ideally you want your blood pressure below 120/80. Hypertension forces your heart to work harder to pump blood. Your arteries may become narrow or stiff. Having untreated or uncontrolled hypertension can cause heart attack, stroke, kidney disease, and other problems. RISK FACTORS Some risk factors for high blood pressure are controllable. Others are not.  Risk factors you cannot control include:   Race. You may be at higher risk if you are African American.  Age. Risk increases with age.  Gender. Men are at higher risk than women before age 145 years. After age 51, women are at higher risk than men. Risk factors you can control include:  Not getting enough exercise or physical activity.  Being overweight.  Getting too much fat, sugar, calories, or salt in your diet.  Drinking too much alcohol. SIGNS AND SYMPTOMS Hypertension does not usually cause signs or symptoms. Extremely high blood pressure (hypertensive crisis) may cause headache, anxiety, shortness of breath, and nosebleed. DIAGNOSIS To check if you have hypertension, your health care provider will measure your blood pressure while you are seated, with your arm held at the level of your heart. It should be measured at least twice using the same arm. Certain conditions can cause a difference in blood pressure between your right and left arms. A blood pressure reading that is higher than normal on one occasion does not mean that you need treatment. If it is not clear whether you have high blood pressure, you may be asked to return on a different day to have your blood pressure checked again. Or, you may be asked to monitor your blood pressure at home for 1 or  more weeks. TREATMENT Treating high blood pressure includes making lifestyle changes and possibly taking medicine. Living a healthy lifestyle can help lower high blood pressure. You may need to change some of your habits. Lifestyle  changes may include:  Following the DASH diet. This diet is high in fruits, vegetables, and whole grains. It is low in salt, red meat, and added sugars.  Keep your sodium intake below 2,300 mg per day.  Getting at least 30-45 minutes of aerobic exercise at least 4 times per week.  Losing weight if necessary.  Not smoking.  Limiting alcoholic beverages.  Learning ways to reduce stress. Your health care provider may prescribe medicine if lifestyle changes are not enough to get your blood pressure under control, and if one of the following is true:  You are 62-55 years of age and your systolic blood pressure is above 140.  You are 18 years of age or older, and your systolic blood pressure is above 150.  Your diastolic blood pressure is above 90.  You have diabetes, and your systolic blood pressure is over 140 or your diastolic blood pressure is over 90.  You have kidney disease and your blood pressure is above 140/90.  You have heart disease and your blood pressure is above 140/90. Your personal target blood pressure may vary depending on your medical conditions, your age, and other factors. HOME CARE INSTRUCTIONS  Have your blood pressure rechecked as directed by your health care provider.   Take medicines only as directed by your health care provider. Follow the directions carefully. Blood pressure medicines must be taken as prescribed. The medicine does not work as well when you skip doses. Skipping doses also puts you at risk for problems.  Do not smoke.   Monitor your blood pressure at home as directed by your health care provider. SEEK MEDICAL CARE IF:   You think you are having a reaction to medicines taken.  You have recurrent headaches or feel dizzy.  You have swelling in your ankles.  You have trouble with your vision. SEEK IMMEDIATE MEDICAL CARE IF:  You develop a severe headache or confusion.  You have unusual weakness, numbness, or feel faint.  You  have severe chest or abdominal pain.  You vomit repeatedly.  You have trouble breathing. MAKE SURE YOU:   Understand these instructions.  Will watch your condition.  Will get help right away if you are not doing well or get worse.   This information is not intended to replace advice given to you by your health care provider. Make sure you discuss any questions you have with your health care provider.   Document Released: 12/10/2005 Document Revised: 04/26/2015 Document Reviewed: 10/02/2013 Elsevier Interactive Patient Education 2016 [employer]vier Inc[employer] Chronic Pain Chronic pain can be defined as pain that is off and on and lasts for 3-6 months or longer. Many things cause chronic pain, which can make it difficult to make a diagnosis. There are many treatment options available for chronic pain. However, finding a treatment that works well for you may require trying various approaches until the right one is found. Many people benefit from a combination of two or more types of treatment to control their pain. SYMPTOMS  Chronic pain can occur anywhere in the body and can range from mild to very severe. Some types of chronic pain include:  Headache.  Low back pain.  Cancer pain.  Arthritis pain.  Neurogenic pain. This is pain resulting from damage to nerves. People with  chronic pain may also have other symptoms such as:  Depression.  Anger.  Insomnia.  Anxiety. DIAGNOSIS  Your health care provider will help diagnose your condition over time. In many cases, the initial focus will be on excluding possible conditions that could be causing the pain. Depending on your symptoms, your health care provider may order tests to diagnose your condition. Some of these tests may include:   Blood tests.   CT scan.   MRI.   X-rays.   Ultrasounds.   Nerve conduction studies.  You may need to see a specialist.  TREATMENT  Finding treatment that works well may take time. You may be  referred to a pain specialist. He or she may prescribe medicine or therapies, such as:   Mindful meditation or yoga.  Shots (injections) of numbing or pain-relieving medicines into the spine or area of pain.  Local electrical stimulation.  Acupuncture.   Massage therapy.   Aroma, color, light, or sound therapy.   Biofeedback.   Working with a physical therapist to keep from getting stiff.   Regular, gentle exercise.   Cognitive or behavioral therapy.   Group support.  Sometimes, surgery may be recommended.  HOME CARE INSTRUCTIONS   Take all medicines as directed by your health care provider.   Lessen stress in your life by relaxing and doing things such as listening to calming music.   Exercise or be active as directed by your health care provider.   Eat a healthy diet and include things such as vegetables, fruits, fish, and lean meats in your diet.   Keep all follow-up appointments with your health care provider.   Attend a support group with others suffering from chronic pain. SEEK MEDICAL CARE IF:   Your pain gets worse.   You develop a new pain that was not there before.   You cannot tolerate medicines given to you by your health care provider.   You have new symptoms since your last visit with your health care provider.  SEEK IMMEDIATE MEDICAL CARE IF:   You feel weak.   You have decreased sensation or numbness.   You lose control of bowel or bladder function.   Your pain suddenly gets much worse.   You develop shaking.  You develop chills.  You develop confusion.  You develop chest pain.  You develop shortness of breath.  MAKE SURE YOU:  Understand these instructions.  Will watch your condition.  Will get help right away if you are not doing well or get worse.   This information is not intended to replace advice given to you by your health care provider. Make sure you discuss any questions you have with your health  care provider.   Document Released: 09/01/2002 Document Revised: 08/12/2013 Document Reviewed: 06/05/2013 Elsevier Interactive Patient Education [employer]

## 2015-11-04 LAB — RPR

## 2015-11-04 LAB — GC/CHLAMYDIA PROBE AMP
CT Probe RNA: NEGATIVE
GC PROBE AMP APTIMA: NEGATIVE

## 2015-11-04 LAB — HIV ANTIBODY (ROUTINE TESTING W REFLEX): HIV 1&2 Ab, 4th Generation: NONREACTIVE

## 2015-11-16 ENCOUNTER — Ambulatory Visit (INDEPENDENT_AMBULATORY_CARE_PROVIDER_SITE_OTHER): Payer: PRIVATE HEALTH INSURANCE | Admitting: Family Medicine

## 2015-11-16 ENCOUNTER — Encounter: Payer: Self-pay | Admitting: Family Medicine

## 2015-11-16 VITALS — BP 142/64 | HR 70 | Temp 98.1°F | Resp 16 | Ht 71.0 in | Wt 193.0 lb

## 2015-11-16 DIAGNOSIS — I10 Essential (primary) hypertension: Secondary | ICD-10-CM | POA: Diagnosis not present

## 2015-11-16 DIAGNOSIS — E785 Hyperlipidemia, unspecified: Secondary | ICD-10-CM | POA: Diagnosis not present

## 2015-11-16 DIAGNOSIS — F17200 Nicotine dependence, unspecified, uncomplicated: Secondary | ICD-10-CM

## 2015-11-16 LAB — POCT URINALYSIS DIP (DEVICE)
BILIRUBIN URINE: NEGATIVE
Glucose, UA: NEGATIVE mg/dL
HGB URINE DIPSTICK: NEGATIVE
Ketones, ur: NEGATIVE mg/dL
Leukocytes, UA: NEGATIVE
NITRITE: NEGATIVE
Protein, ur: NEGATIVE mg/dL
Specific Gravity, Urine: 1.02 (ref 1.005–1.030)
Urobilinogen, UA: 0.2 mg/dL (ref 0.0–1.0)
pH: 6 (ref 5.0–8.0)

## 2015-11-16 MED ORDER — HYDROCHLOROTHIAZIDE 25 MG PO TABS: 25.0000 mg | ORAL_TABLET | Freq: Every day | ORAL | Status: DC

## 2015-11-16 MED ORDER — LOSARTAN POTASSIUM 50 MG PO TABS: 50.0000 mg | ORAL_TABLET | Freq: Two times a day (BID) | ORAL | Status: DC

## 2015-11-16 MED ORDER — AMLODIPINE BESYLATE 10 MG PO TABS: 10.0000 mg | ORAL_TABLET | Freq: Every day | ORAL | Status: DC

## 2015-11-16 MED ORDER — ATORVASTATIN CALCIUM 10 MG PO TABS: 10.0000 mg | ORAL_TABLET | Freq: Every day | ORAL | Status: DC

## 2015-11-16 NOTE — Patient Instructions (Signed)
DASH Eating Plan °DASH stands for "Dietary Approaches to Stop Hypertension." The DASH eating plan is a healthy eating plan that has been shown to reduce high blood pressure (hypertension). Additional health benefits may include reducing the risk of type 2 diabetes mellitus, heart disease, and stroke. The DASH eating plan may also help with weight loss. °WHAT DO I NEED TO KNOW ABOUT THE DASH EATING PLAN? °For the DASH eating plan, you will follow these general guidelines: °· Choose foods with a percent daily value for sodium of less than 5% (as listed on the food label). °· Use salt-free seasonings or herbs instead of table salt or sea salt. °· Check with your health care provider or pharmacist before using salt substitutes. °· Eat lower-sodium products, often labeled as "lower sodium" or "no salt added." °· Eat fresh foods. °· Eat more vegetables, fruits, and low-fat dairy products. °· Choose whole grains. Look for the word "whole" as the first word in the ingredient list. °· Choose fish and skinless chicken or turkey more often than red meat. Limit fish, poultry, and meat to 6 oz (170 g) each day. °· Limit sweets, desserts, sugars, and sugary drinks. °· Choose heart-healthy fats. °· Limit cheese to 1 oz (28 g) per day. °· Eat more home-cooked food and less restaurant, buffet, and fast food. °· Limit fried foods. °· Cook foods using methods other than frying. °· Limit canned vegetables. If you do use them, rinse them well to decrease the sodium. °· When eating at a restaurant, ask that your food be prepared with less salt, or no salt if possible. °WHAT FOODS CAN I EAT? °Seek help from a dietitian for individual calorie needs. °Grains °Whole grain or whole wheat bread. Brown rice. Whole grain or whole wheat pasta. Quinoa, bulgur, and whole grain cereals. Low-sodium cereals. Corn or whole wheat flour tortillas. Whole grain cornbread. Whole grain crackers. Low-sodium crackers. °Vegetables °Fresh or frozen vegetables  (raw, steamed, roasted, or grilled). Low-sodium or reduced-sodium tomato and vegetable juices. Low-sodium or reduced-sodium tomato sauce and paste. Low-sodium or reduced-sodium canned vegetables.  °Fruits °All fresh, canned (in natural juice), or frozen fruits. °Meat and Other Protein Products °Ground beef (85% or leaner), grass-fed beef, or beef trimmed of fat. Skinless chicken or turkey. Ground chicken or turkey. Pork trimmed of fat. All fish and seafood. Eggs. Dried beans, peas, or lentils. Unsalted nuts and seeds. Unsalted canned beans. °Dairy °Low-fat dairy products, such as skim or 1% milk, 2% or reduced-fat cheeses, low-fat ricotta or cottage cheese, or plain low-fat yogurt. Low-sodium or reduced-sodium cheeses. °Fats and Oils °Tub margarines without trans fats. Light or reduced-fat mayonnaise and salad dressings (reduced sodium). Avocado. Safflower, olive, or canola oils. Natural peanut or almond butter. °Other °Unsalted popcorn and pretzels. °The items listed above may not be a complete list of recommended foods or beverages. Contact your dietitian for more options. °WHAT FOODS ARE NOT RECOMMENDED? °Grains °White bread. White pasta. White rice. Refined cornbread. Bagels and croissants. Crackers that contain trans fat. °Vegetables °Creamed or fried vegetables. Vegetables in a cheese sauce. Regular canned vegetables. Regular canned tomato sauce and paste. Regular tomato and vegetable juices. °Fruits °Dried fruits. Canned fruit in light or heavy syrup. Fruit juice. °Meat and Other Protein Products °Fatty cuts of meat. Ribs, chicken wings, bacon, sausage, bologna, salami, chitterlings, fatback, hot dogs, bratwurst, and packaged luncheon meats. Salted nuts and seeds. Canned beans with salt. °Dairy °Whole or 2% milk, cream, half-and-half, and cream cheese. Whole-fat or sweetened yogurt. Full-fat   cheeses or blue cheese. Nondairy creamers and whipped toppings. Processed cheese, cheese spreads, or cheese  curds. °Condiments °Onion and garlic salt, seasoned salt, table salt, and sea salt. Canned and packaged gravies. Worcestershire sauce. Tartar sauce. Barbecue sauce. Teriyaki sauce. Soy sauce, including reduced sodium. Steak sauce. Fish sauce. Oyster sauce. Cocktail sauce. Horseradish. Ketchup and mustard. Meat flavorings and tenderizers. Bouillon cubes. Hot sauce. Tabasco sauce. Marinades. Taco seasonings. Relishes. °Fats and Oils °Butter, stick margarine, lard, shortening, ghee, and bacon fat. Coconut, palm kernel, or palm oils. Regular salad dressings. °Other °Pickles and olives. Salted popcorn and pretzels. °The items listed above may not be a complete list of foods and beverages to avoid. Contact your dietitian for more information. °WHERE CAN I FIND MORE INFORMATION? °National Heart, Lung, and Blood Institute: www.nhlbi.nih.gov/health/health-topics/topics/dash/ °  °This information is not intended to replace advice given to you by your health care provider. Make sure you discuss any questions you have with your health care provider. °  °Document Released: 11/29/2011 Document Revised: 12/31/2014 Document Reviewed: 10/14/2013 °Elsevier Interactive Patient Education ©2016 Elsevier Inc. ° °Hypertension °Hypertension, commonly called high blood pressure, is when the force of blood pumping through your arteries is too strong. Your arteries are the blood vessels that carry blood from your heart throughout your body. A blood pressure reading consists of a higher number over a lower number, such as 110/72. The higher number (systolic) is the pressure inside your arteries when your heart pumps. The lower number (diastolic) is the pressure inside your arteries when your heart relaxes. Ideally you want your blood pressure below 120/80. °Hypertension forces your heart to work harder to pump blood. Your arteries may become narrow or stiff. Having untreated or uncontrolled hypertension can cause heart attack, stroke, kidney  disease, and other problems. °RISK FACTORS °Some risk factors for high blood pressure are controllable. Others are not.  °Risk factors you cannot control include:  °· Race. You may be at higher risk if you are African American. °· Age. Risk increases with age. °· Gender. Men are at higher risk than women before age 45 years. After age 65, women are at higher risk than men. °Risk factors you can control include: °· Not getting enough exercise or physical activity. °· Being overweight. °· Getting too much fat, sugar, calories, or salt in your diet. °· Drinking too much alcohol. °SIGNS AND SYMPTOMS °Hypertension does not usually cause signs or symptoms. Extremely high blood pressure (hypertensive crisis) may cause headache, anxiety, shortness of breath, and nosebleed. °DIAGNOSIS °To check if you have hypertension, your health care provider will measure your blood pressure while you are seated, with your arm held at the level of your heart. It should be measured at least twice using the same arm. Certain conditions can cause a difference in blood pressure between your right and left arms. A blood pressure reading that is higher than normal on one occasion does not mean that you need treatment. If it is not clear whether you have high blood pressure, you may be asked to return on a different day to have your blood pressure checked again. Or, you may be asked to monitor your blood pressure at home for 1 or more weeks. °TREATMENT °Treating high blood pressure includes making lifestyle changes and possibly taking medicine. Living a healthy lifestyle can help lower high blood pressure. You may need to change some of your habits. °Lifestyle changes may include: °· Following the DASH diet. This diet is high in fruits, vegetables, and whole   grains. It is low in salt, red meat, and added sugars. °· Keep your sodium intake below 2,300 mg per day. °· Getting at least 30-45 minutes of aerobic exercise at least 4 times per  week. °· Losing weight if necessary. °· Not smoking. °· Limiting alcoholic beverages. °· Learning ways to reduce stress. °Your health care provider may prescribe medicine if lifestyle changes are not enough to get your blood pressure under control, and if one of the following is true: °· You are 18-59 years of age and your systolic blood pressure is above 140. °· You are 60 years of age or older, and your systolic blood pressure is above 150. °· Your diastolic blood pressure is above 90. °· You have diabetes, and your systolic blood pressure is over 140 or your diastolic blood pressure is over 90. °· You have kidney disease and your blood pressure is above 140/90. °· You have heart disease and your blood pressure is above 140/90. °Your personal target blood pressure may vary depending on your medical conditions, your age, and other factors. °HOME CARE INSTRUCTIONS °· Have your blood pressure rechecked as directed by your health care provider.   °· Take medicines only as directed by your health care provider. Follow the directions carefully. Blood pressure medicines must be taken as prescribed. The medicine does not work as well when you skip doses. Skipping doses also puts you at risk for problems. °· Do not smoke.   °· Monitor your blood pressure at home as directed by your health care provider.  °SEEK MEDICAL CARE IF:  °· You think you are having a reaction to medicines taken. °· You have recurrent headaches or feel dizzy. °· You have swelling in your ankles. °· You have trouble with your vision. °SEEK IMMEDIATE MEDICAL CARE IF: °· You develop a severe headache or confusion. °· You have unusual weakness, numbness, or feel faint. °· You have severe chest or abdominal pain. °· You vomit repeatedly. °· You have trouble breathing. °MAKE SURE YOU:  °· Understand these instructions. °· Will watch your condition. °· Will get help right away if you are not doing well or get worse. °  °This information is not intended to  replace advice given to you by your health care provider. Make sure you discuss any questions you have with your health care provider. °  °Document Released: 12/10/2005 Document Revised: 04/26/2015 Document Reviewed: 10/02/2013 °Elsevier Interactive Patient Education ©2016 Else[employer] Inc. ° °

## 2015-11-16 NOTE — Progress Notes (Signed)
Subjective:    Patient ID: Troy Elliott, male    DOB: 08/22/1966, 49 y.o.   MRN: [id number]  HPI Patient has a history of uncontrolled hypertension. He states that he has been out of blood pressure medications for almost a year. He states that he has been taking medications consistently and has been following a low sodium diet over the past 2 weeks. Marland Kitchen He maintains that he has been an every day smoker for the past 30 years. He smokes 1 pack every other day. Patient denies dizziness, fatigue, chest pains, shortness of breath, heart palpitations, near syncope, or lower extremity edema.  Past Medical History  Diagnosis Date  . Hypertension   . Asthma    Social History   Social History  . Marital Status: Single    Spouse Name: N/A  . Number of Children: N/A  . Years of Education: N/A   Occupational History  . Not on file.   Social History Main Topics  . Smoking status: Current Every Day Smoker -- 0.50 packs/day    Types: Cigarettes  . Smokeless tobacco: Not on file  . Alcohol Use: Yes     Comment: 1 or 2 beers dialy  . Drug Use: No  . Sexual Activity: Not on file   Other Topics Concern  . Not on file   Social History Narrative   Allergies  Allergen Reactions  . Dilantin [Phenytoin Sodium Extended] Hives and Rash    Review of Systems  Constitutional: Negative.  Negative for fever and fatigue.  HENT: Negative.   Eyes: Negative.   Respiratory: Negative.   Cardiovascular: Negative.   Gastrointestinal: Negative.   Endocrine: Negative.  Negative for polydipsia, polyphagia and polyuria.  Genitourinary: Negative.   Musculoskeletal: Negative.   Skin: Negative.   Allergic/Immunologic: Negative.   Neurological: Negative.  Negative for weakness.  Hematological: Negative.   Psychiatric/Behavioral: Negative.  Negative for suicidal ideas and sleep disturbance.       Objective:   Physical Exam  Constitutional: He is oriented to person, place, and time. He appears  well-developed and well-nourished.  HENT:  Head: Normocephalic and atraumatic.  Right Ear: External ear normal.  Left Ear: External ear normal.  Mouth/Throat: Oropharynx is clear and moist.  Eyes: Conjunctivae and EOM are normal. Pupils are equal, round, and reactive to light.  Neck: Normal range of motion. Neck supple.  Cardiovascular: Normal rate, normal heart sounds and intact distal pulses.   Pulmonary/Chest: Effort normal and breath sounds normal.  Abdominal: Soft. Bowel sounds are normal.  Musculoskeletal: Normal range of motion.  Neurological: He is alert and oriented to person, place, and time. He has normal reflexes.  Skin: Skin is warm and dry.  Psychiatric: He has a normal mood and affect. His behavior is normal. Judgment and thought content normal.      BP 142/64 mmHg  Pulse 70  Temp(Src) 98.1 F (36.7 C) (Oral)  Resp 16  Ht  (1.803 m)  Wt 193 lb (87.544 kg)  BMI 26.93 kg/m2 Assessment & Plan:  1. Uncontrolled hypertension Blood pressure is at goal on current medication regimen. The patient is asked to make an attempt to improve diet and exercise patterns to aid in medical management of this problem. Troy Elliott will follow up in office in 3 months for hypertension.  - amLODipine (NORVASC) 10 MG tablet; Take 1 tablet (10 mg total) by mouth daily.  Dispense: 30 tablet; Refill: 2 - losartan (COZAAR) 50 MG tablet; Take 1 tablet (  50 mg total) by mouth 2 (two) times daily.  Dispense: 60 tablet; Refill: 2 - hydrochlorothiazide (HYDRODIURIL) 25 MG tablet; Take 1 tablet (25 mg total) by mouth daily.  Dispense: 30 tablet; Refill: 2 - POCT urinalysis dipstick  2. Tobacco dependence Smoking cessation instruction/counseling given:  counseled patient on the dangers of tobacco use, advised patient to stop smoking, and reviewed strategies to maximize success  3. Hyperlipidemia - atorvastatin (LIPITOR) 10 MG tablet; Take 1 tablet (10 mg total) by mouth daily.  Dispense: 30  tablet; Refill: 2  Tanasia Budzinski M, FNP  RTC: 3 months for hypertension and hyperlipidemia.   The patient was given clear instructions to go to ER or return to medical center if symptoms do not improve, worsen or new problems develop. The patient verbalized understanding. Will notify patient with laboratory results.

## 2015-12-07 ENCOUNTER — Encounter: Payer: Self-pay | Admitting: Family Medicine

## 2015-12-07 ENCOUNTER — Ambulatory Visit (INDEPENDENT_AMBULATORY_CARE_PROVIDER_SITE_OTHER): Payer: PRIVATE HEALTH INSURANCE | Admitting: Family Medicine

## 2015-12-07 VITALS — BP 129/87 | HR 93 | Temp 98.5°F | Resp 16 | Ht 71.0 in | Wt 196.0 lb

## 2015-12-07 DIAGNOSIS — Z8669 Personal history of other diseases of the nervous system and sense organs: Secondary | ICD-10-CM

## 2015-12-07 DIAGNOSIS — M778 Other enthesopathies, not elsewhere classified: Secondary | ICD-10-CM | POA: Diagnosis not present

## 2015-12-07 DIAGNOSIS — M25539 Pain in unspecified wrist: Secondary | ICD-10-CM | POA: Diagnosis not present

## 2015-12-07 DIAGNOSIS — I10 Essential (primary) hypertension: Secondary | ICD-10-CM | POA: Diagnosis not present

## 2015-12-07 DIAGNOSIS — F17200 Nicotine dependence, unspecified, uncomplicated: Secondary | ICD-10-CM

## 2015-12-07 DIAGNOSIS — J302 Other seasonal allergic rhinitis: Secondary | ICD-10-CM

## 2015-12-07 MED ORDER — KETOROLAC TROMETHAMINE 60 MG/2ML IM SOLN
60.0000 mg | Freq: Once | INTRAMUSCULAR | Status: AC
  Administered 2015-12-07: 60 mg via INTRAMUSCULAR

## 2015-12-07 MED ORDER — LOSARTAN POTASSIUM 50 MG PO TABS: 50.0000 mg | ORAL_TABLET | Freq: Two times a day (BID) | ORAL | Status: DC

## 2015-12-07 MED ORDER — GABAPENTIN 100 MG PO CAPS: 100.0000 mg | ORAL_CAPSULE | Freq: Three times a day (TID) | ORAL | Status: DC

## 2015-12-07 MED ORDER — FLUTICASONE PROPIONATE 50 MCG/ACT NA SUSP: 1.0000 | Freq: Every day | NASAL | Status: DC | PRN

## 2015-12-07 NOTE — Patient Instructions (Addendum)
Will add gabapentin 100 mg three times per day as needed. Recommend that patient refrain from drinking alcohol, driving, or operating machinery while taking medication.   Recommend that he wear carpal tunnel brace to bilateral upper extremities.   Will send a referral to pain management. Carpal Tunnel Syndrome Carpal tunnel syndrome is a condition that causes pain in your hand and arm. The carpal tunnel is a narrow area located on the palm side of your wrist. Repeated wrist motion or certain diseases may cause swelling within the tunnel. This swelling pinches the main nerve in the wrist (median nerve). CAUSES  This condition may be caused by:   Repeated wrist motions.  Wrist injuries.  Arthritis.  A cyst or tumor in the carpal tunnel.  Fluid buildup during pregnancy. Sometimes the cause of this condition is not known.  RISK FACTORS This condition is more likely to develop in:   People who have jobs that cause them to repeatedly move their wrists in the same motion, such as butchers and cashiers.  Women.  People with certain conditions, such as:  Diabetes.  Obesity.  An underactive thyroid (hypothyroidism).  Kidney failure. SYMPTOMS  Symptoms of this condition include:   A tingling feeling in your fingers, especially in your thumb, index, and middle fingers.  Tingling or numbness in your hand.  An aching feeling in your entire arm, especially when your wrist and elbow are bent for long periods of time.  Wrist pain that goes up your arm to your shoulder.  Pain that goes down into your palm or fingers.  A weak feeling in your hands. You may have trouble grabbing and holding items. Your symptoms may feel worse during the night.  DIAGNOSIS  This condition is diagnosed with a medical history and physical exam. You may also have tests, including:   An electromyogram (EMG). This test measures electrical signals sent by your nerves into the muscles.  X-rays. TREATMENT   Treatment for this condition includes:  Lifestyle changes. It is important to stop doing or modify the activity that caused your condition.  Physical or occupational therapy.  Medicines for pain and inflammation. This may include medicine that is injected into your wrist.  A wrist splint.  Surgery. HOME CARE INSTRUCTIONS  If You Have a Splint:  Wear it as told by your health care provider. Remove it only as told by your health care provider.  Loosen the splint if your fingers become numb and tingle, or if they turn cold and blue.  Keep the splint clean and dry. General Instructions  Take over-the-counter and prescription medicines only as told by your health care provider.  Rest your wrist from any activity that may be causing your pain. If your condition is work related, talk to your employer about changes that can be made, such as getting a wrist pad to use while typing.  If directed, apply ice to the painful area:  Put ice in a plastic bag.  Place a towel between your skin and the bag.  Leave the ice on for 20 minutes, 2-3 times per day.  Keep all follow-up visits as told by your health care provider. This is important.  Do any exercises as told by your health care provider, physical therapist, or occupational therapist. SEEK MEDICAL CARE IF:   You have new symptoms.  Your pain is not controlled with medicines.  Your symptoms get worse.   This information is not intended to replace advice given to you by your  health care provider. Make sure you discuss any questions you have with your health care provider.   Document Released: 12/07/2000 Document Revised: 08/31/2015 Document Reviewed: 04/27/2015 Elsevier Interactive Patient Education 2016 [employer] Arthritis Arthritis is a term that is commonly used to refer to joint pain or joint disease. There are more than 100 types of arthritis. CAUSES The most common cause of this condition is wear and tear of a  joint. Other causes include:  Gout.  Inflammation of a joint.  An infection of a joint.  Sprains and other injuries near the joint.  A drug reaction or allergic reaction. In some cases, the cause may not be known. SYMPTOMS The main symptom of this condition is pain in the joint with movement. Other symptoms include:  Redness, swelling, or stiffness at a joint.  Warmth coming from the joint.  Fever.  Overall feeling of illness. DIAGNOSIS This condition may be diagnosed with a physical exam and tests, including:  Blood tests.  Urine tests.  Imaging tests, such as MRI, X-rays, or a CT scan. Sometimes, fluid is removed from a joint for testing. TREATMENT Treatment for this condition may involve:  Treatment of the cause, if it is known.  Rest.  Raising (elevating) the joint.  Applying cold or hot packs to the joint.  Medicines to improve symptoms and reduce inflammation.  Injections of a steroid such as cortisone into the joint to help reduce pain and inflammation. Depending on the cause of your arthritis, you may need to make lifestyle changes to reduce stress on your joint. These changes may include exercising more and losing weight. HOME CARE INSTRUCTIONS Medicines  Take over-the-counter and prescription medicines only as told by your health care provider.  Do not take aspirin to relieve pain if gout is suspected. Activities  Rest your joint if told by your health care provider. Rest is important when your disease is active and your joint feels painful, swollen, or stiff.  Avoid activities that make the pain worse. It is important to balance activity with rest.  Exercise your joint regularly with range-of-motion exercises as told by your health care provider. Try doing low-impact exercise, such as:  Swimming.  Water aerobics.  Biking.  Walking. Joint Care  If your joint is swollen, keep it elevated if told by your health care provider.  If your  joint feels stiff in the morning, try taking a warm shower.  If directed, apply heat to the joint. If you have diabetes, do not apply heat without permission from your health care provider.  Put a towel between the joint and the hot pack or heating pad.  Leave the heat on the area for 20-30 minutes.  If directed, apply ice to the joint:  Put ice in a plastic bag.  Place a towel between your skin and the bag.  Leave the ice on for 20 minutes, 2-3 times per day.  Keep all follow-up visits as told by your health care provider. This is important. SEEK MEDICAL CARE IF:  The pain gets worse.  You have a fever. SEEK IMMEDIATE MEDICAL CARE IF:  You develop severe joint pain, swelling, or redness.  Many joints become painful and swollen.  You develop severe back pain.  You develop severe weakness in your leg.  You cannot control your bladder or bowels.   This information is not intended to replace advice given to you by your health care provider. Make sure you discuss any questions you have with your health  care provider.   Document Released: 01/17/2005 Document Revised: 08/31/2015 Document Reviewed: 03/07/2015 Elsevier Interactive Patient Education [employer]

## 2015-12-07 NOTE — Progress Notes (Signed)
Subjective:    Patient ID: Troy Elliott, male    DOB: 12/08/1966, 49 y.o.   MRN: [id number]  HPI  Mr. Troy HellingMichael Elliott, a 49 year old male with a history of hypertension presents for chronic pain. He states that pain is primarily to wrists bilaterally. He also reports low back pain periodically. He maintains that he was followed by pain management [facility] He states that pain was controlled on Oxycodone 5 mg every 6 hours as needed. He states that he has not attempted any OTC interventions to alleviate current symptoms. He describes pain as burning and shooting. He also reports a history of carpel tunnel syndrome bilaterally. He states that pain occurs primarily at night. He denies fever or fatigue.   Patient has a history of uncontrolled hypertension.  He states that he has been taking medications consistently and has been following a low sodium diet over the past several weeks.  He maintains that he has been an every day smoker for the past 30 years. He smokes 1 pack every other day. Patient denies dizziness, fatigue, chest pains, shortness of breath, heart palpitations, near syncope, or lower extremity edema.  Past Medical History  Diagnosis Date  . Hypertension   . Asthma    Social History   Social History  . Marital Status: Single    Spouse Name: N/A  . Number of Children: N/A  . Years of Education: N/A   Occupational History  . Not on file.   Social History Main Topics  . Smoking status: Current Every Day Smoker -- 0.50 packs/day    Types: Cigarettes  . Smokeless tobacco: Not on file  . Alcohol Use: Yes     Comment: 1 or 2 beers dialy  . Drug Use: No  . Sexual Activity: Not on file   Other Topics Concern  . Not on file   Social History Narrative   Allergies  Allergen Reactions  . Dilantin [Phenytoin Sodium Extended] Hives and Rash    Review of Systems  Constitutional: Negative.  Negative for fatigue.  HENT: Negative.   Eyes: Negative.   Negative for photophobia and visual disturbance.  Respiratory: Negative.   Cardiovascular: Negative.   Gastrointestinal: Negative.   Endocrine: Negative.  Negative for polydipsia, polyphagia and polyuria.  Genitourinary: Negative.   Skin: Negative.   Allergic/Immunologic: Negative.   Neurological: Negative.   Hematological: Negative.   Psychiatric/Behavioral: Negative.  Negative for suicidal ideas and sleep disturbance.       Objective:   Physical Exam  Constitutional: He is oriented to person, place, and time. He appears well-developed and well-nourished.  HENT:  Head: Normocephalic and atraumatic.  Right Ear: External ear normal.  Left Ear: External ear normal.  Mouth/Throat: Oropharynx is clear and moist.  Eyes: Conjunctivae and EOM are normal. Pupils are equal, round, and reactive to light.  Neck: Normal range of motion. Neck supple.  Cardiovascular: Normal rate, normal heart sounds and intact distal pulses.   Pulmonary/Chest: Effort normal and breath sounds normal.  Abdominal: Soft. Bowel sounds are normal.  Musculoskeletal:       Right wrist: He exhibits decreased range of motion and tenderness. He exhibits no swelling.       Left wrist: He exhibits decreased range of motion. He exhibits no swelling.       Lumbar back: He exhibits decreased range of motion and pain. He exhibits no bony tenderness, no swelling and no spasm.  Positive Tinel sign  Neurological: He  is alert and oriented to person, place, and time. He has normal reflexes.  Skin: Skin is warm and dry.  Psychiatric: He has a normal mood and affect. His behavior is normal. Judgment and thought content normal.      BP 129/87 mmHg  Pulse 93  Temp(Src) 98.5 F (36.9 C) (Oral)  Resp 16  Ht  (1.803 m)  Wt 196 lb (88.905 kg)  BMI 27.35 kg/m2 Assessment & Plan:  1. Pain in wrist, unspecified laterality Patient maintains that he was followed by pain management prior to relocating to [facility] for chronic pain  related to tendonitis and carpel tunnel. He states that he has been having chronic pain since 2014. He states that he was taking Oxycodone 5 mg every 6 hours to control pain. I will send a referral to pain management. He is describing current pain as shooting and burning to wrists bilaterally, I will start a 1 month trial of gabapentin for nerve pain.  - ketorolac (TORADOL) injection 60 mg; Inject 2 mLs (60 mg total) into the muscle once. - gabapentin (NEURONTIN) 100 MG capsule; Take 1 capsule (100 mg total) by mouth 3 (three) times daily.  Dispense: 90 capsule; Refill: 0  2. Tendonitis of wrist, right -ketorolac (TORADOL) injection 60 mg; Inject 2 mLs (60 mg total) into the muscle once.  3. History of carpal tunnel syndrome Positive phalen test and tinel sign on physical examination. Recommend the patient wear carpel tunnel brace as previously directed.  - gabapentin (NEURONTIN) 100 MG capsule; Take 1 capsule (100 mg total) by mouth 3 (three) times daily.  Dispense: 90 capsule; Refill: 0  4. Essential hypertension Blood pressure is at goal on current medication regimen - losartan (COZAAR) 50 MG tablet; Take 1 tablet (50 mg total) by mouth 2 (two) times daily.  Dispense: 60 tablet; Refill: 2  5. Seasonal allergies - fluticasone (FLONASE) 50 MCG/ACT nasal spray; Place 1 spray into both nostrils daily as needed for allergies or rhinitis. Reported on 12/07/2015  Dispense: 16 g; Refill: 0  6. Tobacco dependence Smoking cessation instruction/counseling given:  counseled patient on the dangers of tobacco use, advised patient to stop smoking, and reviewed strategies to maximize success  Cardelia Sassano M, FNP  RTC: 1 month The patient was given clear instructions to go to ER or return to medical center if symptoms do not improve, worsen or new problems develop. The patient verbalized understanding. Will notify patient with laboratory results.

## 2016-02-06 MED FILL — LOSARTAN POTASSIUM 50 MG TA: 50 | 30 days supply | Qty: 60 | Fill #1

## 2016-02-06 MED FILL — HYDROCHLOROTHIAZIDE 25 MG T: 25 | 30 days supply | Qty: 30 | Fill #1

## 2016-02-06 MED FILL — ATORVASTATIN 10 MG TABLET: 10 | 30 days supply | Qty: 30 | Fill #1

## 2016-02-06 MED FILL — MONTELUKAST SOD 10 MG TAB: 10 | 30 days supply | Qty: 30 | Fill #1

## 2016-02-06 MED FILL — AMLODIPINE BESYLATE 10 MG T: 10 | 30 days supply | Qty: 30 | Fill #1

## 2016-02-10 ENCOUNTER — Emergency Department (HOSPITAL_COMMUNITY): Payer: PRIVATE HEALTH INSURANCE

## 2016-02-10 ENCOUNTER — Encounter (HOSPITAL_COMMUNITY): Payer: Self-pay | Admitting: Emergency Medicine

## 2016-02-10 ENCOUNTER — Emergency Department (HOSPITAL_COMMUNITY)
Admission: EM | Admit: 2016-02-10 | Discharge: 2016-02-10 | Disposition: A | Discharge status: Home / Self Care | Payer: PRIVATE HEALTH INSURANCE | Attending: Emergency Medicine | Admitting: Emergency Medicine

## 2016-02-10 DIAGNOSIS — J45909 Unspecified asthma, uncomplicated: Secondary | ICD-10-CM | POA: Diagnosis not present

## 2016-02-10 DIAGNOSIS — I10 Essential (primary) hypertension: Secondary | ICD-10-CM | POA: Diagnosis not present

## 2016-02-10 DIAGNOSIS — M545 Low back pain: Secondary | ICD-10-CM | POA: Insufficient documentation

## 2016-02-10 DIAGNOSIS — F17210 Nicotine dependence, cigarettes, uncomplicated: Secondary | ICD-10-CM | POA: Insufficient documentation

## 2016-02-10 DIAGNOSIS — R079 Chest pain, unspecified: Secondary | ICD-10-CM | POA: Insufficient documentation

## 2016-02-10 LAB — BASIC METABOLIC PANEL
Anion gap: 10 (ref 5–15)
BUN: 7 mg/dL (ref 6–20)
CHLORIDE: 103 mmol/L (ref 101–111)
CO2: 28 mmol/L (ref 22–32)
Calcium: 9.7 mg/dL (ref 8.9–10.3)
Creatinine, Ser: 1.04 mg/dL (ref 0.61–1.24)
GFR calc Af Amer: >60 mL/min (ref >60)
GLUCOSE: 113 mg/dL — AB (ref 65–99)
POTASSIUM: 4.3 mmol/L (ref 3.5–5.1)
Sodium: 141 mmol/L (ref 135–145)

## 2016-02-10 LAB — CBC
HEMATOCRIT: 44.5 % (ref 39.0–52.0)
Hemoglobin: 15 g/dL (ref 13.0–17.0)
MCH: 31.3 pg (ref 26.0–34.0)
MCHC: 33.7 g/dL (ref 30.0–36.0)
MCV: 92.7 fL (ref 78.0–100.0)
Platelets: 229 10*3/uL (ref 150–400)
RBC: 4.8 MIL/uL (ref 4.22–5.81)
RDW: 12.6 % (ref 11.5–15.5)
WBC: 4.9 10*3/uL (ref 4.0–10.5)

## 2016-02-10 LAB — I-STAT TROPONIN, ED: Troponin i, poc: 0 ng/mL (ref 0.00–0.08)

## 2016-02-10 NOTE — ED Notes (Signed)
Pt sts that he is leaving to pick up a friend and will either come back in the morning or tonight,

## 2016-02-10 NOTE — ED Notes (Signed)
Pt reports CP starting yesterday on the Left side of his chest. Pt also has secondary complaint of left side lower back pain. Pt alert x4. NAD at this time.

## 2016-02-15 ENCOUNTER — Encounter (HOSPITAL_COMMUNITY): Payer: Self-pay | Admitting: *Deleted

## 2016-02-15 ENCOUNTER — Emergency Department (HOSPITAL_COMMUNITY)
Admission: EM | Admit: 2016-02-15 | Discharge: 2016-02-15 | Disposition: A | Discharge status: Home / Self Care | Payer: Medicaid Other | Attending: Emergency Medicine | Admitting: Emergency Medicine

## 2016-02-15 DIAGNOSIS — Z9889 Other specified postprocedural states: Secondary | ICD-10-CM | POA: Diagnosis not present

## 2016-02-15 DIAGNOSIS — M545 Low back pain, unspecified: Secondary | ICD-10-CM

## 2016-02-15 DIAGNOSIS — M25562 Pain in left knee: Secondary | ICD-10-CM | POA: Diagnosis not present

## 2016-02-15 DIAGNOSIS — Z79899 Other long term (current) drug therapy: Secondary | ICD-10-CM | POA: Diagnosis not present

## 2016-02-15 DIAGNOSIS — I10 Essential (primary) hypertension: Secondary | ICD-10-CM | POA: Diagnosis not present

## 2016-02-15 DIAGNOSIS — M25512 Pain in left shoulder: Secondary | ICD-10-CM

## 2016-02-15 DIAGNOSIS — F17210 Nicotine dependence, cigarettes, uncomplicated: Secondary | ICD-10-CM | POA: Insufficient documentation

## 2016-02-15 DIAGNOSIS — G8929 Other chronic pain: Secondary | ICD-10-CM | POA: Insufficient documentation

## 2016-02-15 DIAGNOSIS — J45909 Unspecified asthma, uncomplicated: Secondary | ICD-10-CM | POA: Diagnosis not present

## 2016-02-15 DIAGNOSIS — M542 Cervicalgia: Secondary | ICD-10-CM | POA: Diagnosis not present

## 2016-02-15 DIAGNOSIS — M79605 Pain in left leg: Secondary | ICD-10-CM

## 2016-02-15 MED ORDER — METHOCARBAMOL 500 MG PO TABS: 500.0000 mg | ORAL_TABLET | Freq: Two times a day (BID) | ORAL | Status: DC

## 2016-02-15 MED ORDER — KETOROLAC TROMETHAMINE 60 MG/2ML IM SOLN
60.0000 mg | Freq: Once | INTRAMUSCULAR | Status: AC
  Administered 2016-02-15: 60 mg via INTRAMUSCULAR
  Filled 2016-02-15: qty 2

## 2016-02-15 NOTE — Discharge Instructions (Signed)
Back Pain, Adult °Back pain is very common in adults. The cause of back pain is rarely dangerous and the pain often gets better over time. The cause of your back pain may not be known. Some common causes of back pain include: °· Strain of the muscles or ligaments supporting the spine. °· Wear and tear (degeneration) of the spinal disks. °· Arthritis. °· Direct injury to the back. °For many people, back pain may return. Since back pain is rarely dangerous, most people can learn to manage this condition on their own. °HOME CARE INSTRUCTIONS °Watch your back pain for any changes. The following actions may help to lessen any discomfort you are feeling: °· Remain active. It is stressful on your back to sit or stand in one place for long periods of time. Do not sit, drive, or stand in one place for more than 30 minutes at a time. Take short walks on even surfaces as soon as you are able. Try to increase the length of time you walk each day. °· Exercise regularly as directed by your health care provider. Exercise helps your back heal faster. It also helps avoid future injury by keeping your muscles strong and flexible. °· Do not stay in bed. Resting more than 1-2 days can delay your recovery. °· Pay attention to your body when you bend and lift. The most comfortable positions are those that put less stress on your recovering back. Always use proper lifting techniques, including: °· Bending your knees. °· Keeping the load close to your body. °· Avoiding twisting. °· Find a comfortable position to sleep. Use a firm mattress and lie on your side with your knees slightly bent. If you lie on your back, put a pillow under your knees. °· Avoid feeling anxious or stressed. Stress increases muscle tension and can worsen back pain. It is important to recognize when you are anxious or stressed and learn ways to manage it, such as with exercise. °· Take medicines only as directed by your health care provider. Over-the-counter  medicines to reduce pain and inflammation are often the most helpful. Your health care provider may prescribe muscle relaxant drugs. These medicines help dull your pain so you can more quickly return to your normal activities and healthy exercise. °· Apply ice to the injured area: °· Put ice in a plastic bag. °· Place a towel between your skin and the bag. °· Leave the ice on for 20 minutes, 2-3 times a day for the first 2-3 days. After that, ice and heat may be alternated to reduce pain and spasms. °· Maintain a healthy weight. Excess weight puts extra stress on your back and makes it difficult to maintain good posture. °SEEK MEDICAL CARE IF: °· You have pain that is not relieved with rest or medicine. °· You have increasing pain going down into the legs or buttocks. °· You have pain that does not improve in one week. °· You have night pain. °· You lose weight. °· You have a fever or chills. °SEEK IMMEDIATE MEDICAL CARE IF:  °1. You develop new bowel or bladder control problems. °2. You have unusual weakness or numbness in your arms or legs. °3. You develop nausea or vomiting. °4. You develop abdominal pain. °5. You feel faint. °  °This information is not intended to replace advice given to you by your health care provider. Make sure you discuss any questions you have with your health care provider. °  °Document Released: 12/10/2005 Document Revised: 12/31/2014 Document Reviewed: 04/13/2014 °Elsevier Interactive Patient Education ©2016 [employer]   Inc[employer] ° °Back Exercises °The following exercises strengthen the muscles that help to support the back. They also help to keep the lower back flexible. Doing these exercises can help to prevent back pain or lessen existing pain. °If you have back pain or discomfort, try doing these exercises 2-3 times each day or as told by your health care provider. When the pain goes away, do them once each day, but increase the number of times that you repeat the steps for each exercise (do  more repetitions). If you do not have back pain or discomfort, do these exercises once each day or as told by your health care provider. °EXERCISES °Single Knee to Chest °Repeat these steps 3-5 times for each leg: °· Lie on your back on a firm bed or the floor with your legs extended. °· Bring one knee to your chest. Your other leg should stay extended and in contact with the floor. °· Hold your knee in place by grabbing your knee or thigh. °· Pull on your knee until you feel a gentle stretch in your lower back. °· Hold the stretch for 10-30 seconds. °· Slowly release and straighten your leg. °Pelvic Tilt °Repeat these steps 5-10 times: °· Lie on your back on a firm bed or the floor with your legs extended. °· Bend your knees so they are pointing toward the ceiling and your feet are flat on the floor. °· Tighten your lower abdominal muscles to press your lower back against the floor. This motion will tilt your pelvis so your tailbone points up toward the ceiling instead of pointing to your feet or the floor. °· With gentle tension and even breathing, hold this position for 5-10 seconds. °Cat-Cow °Repeat these steps until your lower back becomes more flexible: °· Get into a hands-and-knees position on a firm surface. Keep your hands under your shoulders, and keep your knees under your hips. You may place padding under your knees for comfort. °· Let your head hang down, and point your tailbone toward the floor so your lower back becomes rounded like the back of a cat. °· Hold this position for 5 seconds. °· Slowly lift your head and point your tailbone up toward the ceiling so your back forms a sagging arch like the back of a cow. °· Hold this position for 5 seconds. °Press-Ups °Repeat these steps 5-10 times: °6. Lie on your abdomen (face-down) on the floor. °7. Place your palms near your head, about shoulder-width apart. °8. While you keep your back as relaxed as possible and keep your hips on the floor, slowly  straighten your arms to raise the top half of your body and lift your shoulders. Do not use your back muscles to raise your upper torso. You may adjust the placement of your hands to make yourself more comfortable. °9. Hold this position for 5 seconds while you keep your back relaxed. °10. Slowly return to lying flat on the floor. °Bridges °Repeat these steps 10 times: °1. Lie on your back on a firm surface. °2. Bend your knees so they are pointing toward the ceiling and your feet are flat on the floor. °3. Tighten your buttocks muscles and lift your buttocks off of the floor until your waist is at almost the same height as your knees. You should feel the muscles working in your buttocks and the back of your thighs. If you do not feel these muscles, slide your feet 1-2 inches farther away from your buttocks. °4. Hold this   position for 3-5 seconds. 5. Slowly lower your hips to the starting position, and allow your buttocks muscles to relax completely. If this exercise is too easy, try doing it with your arms crossed over your chest. Abdominal Crunches Repeat these steps 5-10 times: 1. Lie on your back on a firm bed or the floor with your legs extended. 2. Bend your knees so they are pointing toward the ceiling and your feet are flat on the floor. 3. Cross your arms over your chest. 4. Tip your chin slightly toward your chest without bending your neck. 5. Tighten your abdominal muscles and slowly raise your trunk (torso) high enough to lift your shoulder blades a tiny bit off of the floor. Avoid raising your torso higher than that, because it can put too much stress on your low back and it does not help to strengthen your abdominal muscles. 6. Slowly return to your starting position. Back Lifts Repeat these steps 5-10 times: 1. Lie on your abdomen (face-down) with your arms at your sides, and rest your forehead on the floor. 2. Tighten the muscles in your legs and your buttocks. 3. Slowly lift your  chest off of the floor while you keep your hips pressed to the floor. Keep the back of your head in line with the curve in your back. Your eyes should be looking at the floor. 4. Hold this position for 3-5 seconds. 5. Slowly return to your starting position. SEEK MEDICAL CARE IF:  Your back pain or discomfort gets much worse when you do an exercise.  Your back pain or discomfort does not lessen within 2 hours after you exercise. If you have any of these problems, stop doing these exercises right away. Do not do them again unless your health care provider says that you can. SEEK IMMEDIATE MEDICAL CARE IF:  You develop sudden, severe back pain. If this happens, stop doing the exercises right away. Do not do them again unless your health care provider says that you can.   This information is not intended to replace advice given to you by your health care provider. Make sure you discuss any questions you have with your health care provider.   Document Released: 01/17/2005 Document Revised: 08/31/2015 Document Reviewed: 02/03/2015 Elsevier Interactive Patient Education 2016 [employer] Inc.  Musculoskeletal Pain Musculoskeletal pain is muscle and boney aches and pains. These pains can occur in any part of the body. Your caregiver may treat you without knowing the cause of the pain. They may treat you if blood or urine tests, X-rays, and other tests were normal.  CAUSES There is often not a definite cause or reason for these pains. These pains may be caused by a type of germ (virus). The discomfort may also come from overuse. Overuse includes working out too hard when your body is not fit. Boney aches also come from weather changes. Bone is sensitive to atmospheric pressure changes. HOME CARE INSTRUCTIONS   Ask when your test results will be ready. Make sure you get your test results.  Only take over-the-counter or prescription medicines for pain, discomfort, or fever as directed by your caregiver.  If you were given medications for your condition, do not drive, operate machinery or power tools, or sign legal documents for 24 hours. Do not drink alcohol. Do not take sleeping pills or other medications that may interfere with treatment.  Continue all activities unless the activities cause more pain. When the pain lessens, slowly resume normal activities. Gradually increase the intensity  and duration of the activities or exercise.  During periods of severe pain, bed rest may be helpful. Lay or sit in any position that is comfortable.  Putting ice on the injured area.  Put ice in a bag.  Place a towel between your skin and the bag.  Leave the ice on for 15 to 20 minutes, 3 to 4 times a day.  Follow up with your caregiver for continued problems and no reason can be found for the pain. If the pain becomes worse or does not go away, it may be necessary to repeat tests or do additional testing. Your caregiver may need to look further for a possible cause. SEEK IMMEDIATE MEDICAL CARE IF:  You have pain that is getting worse and is not relieved by medications.  You develop chest pain that is associated with shortness or breath, sweating, feeling sick to your stomach (nauseous), or throw up (vomit).  Your pain becomes localized to the abdomen.  You develop any new symptoms that seem different or that concern you. MAKE SURE YOU:   Understand these instructions.  Will watch your condition.  Will get help right away if you are not doing well or get worse.   This information is not intended to replace advice given to you by your health care provider. Make sure you discuss any questions you have with your health care provider.   Document Released: 12/10/2005 Document Revised: 03/03/2012 Document Reviewed: 08/14/2013 Elsevier Interactive Patient Education [employer]
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
## 2016-02-15 NOTE — ED Notes (Signed)
Pt stable, ambulatory, states understanding of discharge instructions 

## 2016-02-17 ENCOUNTER — Ambulatory Visit: Payer: PRIVATE HEALTH INSURANCE | Admitting: Family Medicine

## 2016-02-17 MED FILL — AMITRIPTYLINE HCL 25 MG TAB: 25 | 30 days supply | Qty: 90 | Fill #0

## 2016-02-21 MED FILL — METHOCARBAMOL 500 MG TABLET: 500 | 5 days supply | Qty: 10 | Fill #0

## 2016-02-29 ENCOUNTER — Other Ambulatory Visit: Payer: Self-pay

## 2016-02-29 ENCOUNTER — Encounter: Payer: Self-pay | Admitting: Family Medicine

## 2016-02-29 ENCOUNTER — Ambulatory Visit (INDEPENDENT_AMBULATORY_CARE_PROVIDER_SITE_OTHER): Payer: Self-pay | Admitting: Family Medicine

## 2016-02-29 VITALS — BP 174/102 | HR 83 | Temp 97.8°F | Resp 16 | Ht 71.0 in | Wt 200.0 lb

## 2016-02-29 DIAGNOSIS — F319 Bipolar disorder, unspecified: Secondary | ICD-10-CM

## 2016-02-29 DIAGNOSIS — M549 Dorsalgia, unspecified: Secondary | ICD-10-CM

## 2016-02-29 DIAGNOSIS — I10 Essential (primary) hypertension: Secondary | ICD-10-CM

## 2016-02-29 DIAGNOSIS — E785 Hyperlipidemia, unspecified: Secondary | ICD-10-CM

## 2016-02-29 DIAGNOSIS — G8929 Other chronic pain: Secondary | ICD-10-CM

## 2016-02-29 DIAGNOSIS — F17200 Nicotine dependence, unspecified, uncomplicated: Secondary | ICD-10-CM

## 2016-02-29 LAB — POCT URINALYSIS DIP (DEVICE)
BILIRUBIN URINE: NEGATIVE
GLUCOSE, UA: NEGATIVE mg/dL
HGB URINE DIPSTICK: NEGATIVE
Ketones, ur: NEGATIVE mg/dL
LEUKOCYTES UA: NEGATIVE
NITRITE: NEGATIVE
Protein, ur: NEGATIVE mg/dL
Specific Gravity, Urine: 1.015 (ref 1.005–1.030)
UROBILINOGEN UA: 0.2 mg/dL (ref 0.0–1.0)
pH: 5.5 (ref 5.0–8.0)

## 2016-02-29 MED ORDER — KETOROLAC TROMETHAMINE 60 MG/2ML IM SOLN
30.0000 mg | Freq: Once | INTRAMUSCULAR | Status: AC
  Administered 2016-02-29: 30 mg via INTRAMUSCULAR

## 2016-02-29 NOTE — Progress Notes (Signed)
Subjective:    Patient ID: Troy Elliott, male    DOB: 04/06/1966, 50 y.o.   MRN: [id number]  HPI  Troy Elliott, a 50 year old male with a history of hypertension presents for a follow up of hypertension.  He states that he has been taking medications consistently and has been following a low sodium diet over the past several weeks.  He maintains that he has been an every day smoker for the past 30 years. He smokes 1 pack every other day. Patient denies dizziness, fatigue, chest pains, shortness of breath, heart palpitations, near syncope, or lower extremity edema. He states that pain is primarily to lower back intermittently.  He maintains that he was followed by pain management prior to [facility]. He states that pain was controlled previously on Oxycodone 5 mg every 6 hours as needed. Troy Elliott and I discussed the fact that we will not prescribe chronic opiate medications in this office, patient expressed understanding. He states that he has not attempted any OTC interventions to alleviate current symptoms. He describes pain as burning and shooting. He was evaluated in the emergency department for back pain on 01/31/2016 that pain occurs primarily at night. He denies fever or fatigue.   Past Medical History  Diagnosis Date  . Hypertension   . Asthma    Social History   Social History  . Marital Status: Single    Spouse Name: N/A  . Number of Children: N/A  . Years of Education: N/A   Occupational History  . Not on file.   Social History Main Topics  . Smoking status: Current Every Day Smoker -- 0.50 packs/day    Types: Cigarettes  . Smokeless tobacco: Not on file  . Alcohol Use: Yes     Comment: 1 or 2 beers dialy  . Drug Use: No  . Sexual Activity: Not on file   Other Topics Concern  . Not on file   Social History Narrative   Allergies  Allergen Reactions  . Dilantin [Phenytoin Sodium Extended] Hives and Rash    Review of Systems  Constitutional:  Negative.  Negative for malaise/fatigue and fatigue.  HENT: Negative.   Eyes: Negative.  Negative for blurred vision, photophobia and visual disturbance.  Respiratory: Negative.  Negative for shortness of breath.   Cardiovascular: Negative.  Negative for chest pain, palpitations and orthopnea.  Gastrointestinal: Negative.   Endocrine: Negative.  Negative for polydipsia, polyphagia and polyuria.  Genitourinary: Negative.   Musculoskeletal: Positive for back pain. Negative for neck pain.  Skin: Negative.   Allergic/Immunologic: Negative.   Neurological: Negative.  Negative for headaches.  Hematological: Negative.   Psychiatric/Behavioral: Negative.  Negative for suicidal ideas and sleep disturbance.       Objective:   Physical Exam  Constitutional: He is oriented to person, place, and time. He appears well-developed and well-nourished.  HENT:  Head: Normocephalic and atraumatic.  Right Ear: External ear normal.  Left Ear: External ear normal.  Mouth/Throat: Oropharynx is clear and moist.  Eyes: Conjunctivae and EOM are normal. Pupils are equal, round, and reactive to light.  Neck: Normal range of motion. Neck supple.  Cardiovascular: Normal rate, normal heart sounds and intact distal pulses.   Pulmonary/Chest: Effort normal and breath sounds normal.  Abdominal: Soft. Bowel sounds are normal.  Musculoskeletal:       Lumbar back: He exhibits decreased range of motion and pain. He exhibits no bony tenderness, no swelling and no spasm.  Neurological: He  is alert and oriented to person, place, and time. He has normal reflexes.  Skin: Skin is warm and dry.  Psychiatric: He has a normal mood and affect. His behavior is normal. Judgment and thought content normal.      BP 174/102 mmHg  Pulse 83  Temp(Src) 97.8 F (36.6 C) (Oral)  Resp 16  Ht  (1.803 m)  Wt 200 lb (90.719 kg)  BMI 27.91 kg/m2 Assessment & Plan:  1. Essential hypertension Blood pressure is above goal on  medication regimen. He typically takes medication at 9 am and did not take prior to arrival. Patient to return to office in 2 weeks for blood pressure check. The patient is asked to make an attempt to improve diet and exercise patterns to aid in medical management of this problem. - COMPLETE METABOLIC PANEL WITH GFR; Future  2. Hyperlipidemia Continue atorvastatin at current dosage.  - Lipid Panel; Future  3. Tobacco dependence Smoking cessation instruction/counseling given:  counseled patient on the dangers of tobacco use, advised patient to stop smoking, and reviewed strategies to maximize success  4. Chronic back pain Recommend Tylenol 500 mg every 6 hours as needed for mild to moderate back pain. Apply heating pad to lower back for 20 minutes 4 times per day as needed. Sent a referral for pain management previously, awaiting an appointment.  - ketorolac (TORADOL) injection 30 mg; Inject 1 mL (30 mg total) into the muscle once.     RTC: 3 months for hypertension.  Tricha Ruggirello M, FNP    The patient was given clear instructions to go to ER or return to medical center if symptoms do not improve, worsen or new problems develop. The patient verbalized understanding. Will notify patient with laboratory results.

## 2016-03-09 ENCOUNTER — Ambulatory Visit: Payer: Self-pay | Admitting: Family Medicine

## 2016-03-27 MED FILL — ATORVASTATIN 10 MG TABLET: 10 | 30 days supply | Qty: 30 | Fill #2

## 2016-03-27 MED FILL — LOSARTAN POTASSIUM 50 MG TA: 50 | 30 days supply | Qty: 60 | Fill #2

## 2016-05-10 ENCOUNTER — Other Ambulatory Visit: Payer: Self-pay | Admitting: Family Medicine

## 2016-05-29 MED FILL — AMITRIPTYLINE HCL 25 MG TAB: 25 | 30 days supply | Qty: 90 | Fill #1

## 2016-05-29 MED FILL — VENTOLIN HFA 90 MCG INHALER: 108 (90 BAS | 25 days supply | Qty: 18 | Fill #0

## 2016-05-29 MED FILL — LOSARTAN POTASSIUM 50 MG TA: 50 | 30 days supply | Qty: 60 | Fill #0

## 2016-05-29 MED FILL — ?AMLODIPINE BESYLATE 10 MG: 10 | 30 days supply | Qty: 30 | Fill #2

## 2016-05-29 MED FILL — ?MONTELUKAST SOD 10 MG TAB: 10 | 30 days supply | Qty: 30 | Fill #2

## 2016-05-29 MED FILL — HYDROCHLOROTHIAZIDE 25 MG T: 25 | 30 days supply | Qty: 30 | Fill #2

## 2016-05-29 MED FILL — !FLOVENT HFA 110 MCG INHALE: 110 | 60 days supply | Qty: 12 | Fill #0

## 2016-05-31 ENCOUNTER — Ambulatory Visit (HOSPITAL_COMMUNITY)
Admission: RE | Admit: 2016-05-31 | Discharge: 2016-05-31 | Disposition: A | Discharge status: Home / Self Care | Payer: Medicaid Other | Source: Ambulatory Visit | Attending: Family Medicine | Admitting: Family Medicine

## 2016-05-31 ENCOUNTER — Ambulatory Visit (INDEPENDENT_AMBULATORY_CARE_PROVIDER_SITE_OTHER): Payer: Self-pay | Admitting: Family Medicine

## 2016-05-31 ENCOUNTER — Encounter: Payer: Self-pay | Admitting: Family Medicine

## 2016-05-31 VITALS — BP 160/98 | HR 73 | Temp 98.3°F | Resp 16 | Ht 71.0 in | Wt 201.0 lb

## 2016-05-31 DIAGNOSIS — M5136 Other intervertebral disc degeneration, lumbar region: Secondary | ICD-10-CM | POA: Diagnosis not present

## 2016-05-31 DIAGNOSIS — M549 Dorsalgia, unspecified: Secondary | ICD-10-CM

## 2016-05-31 DIAGNOSIS — G8929 Other chronic pain: Secondary | ICD-10-CM

## 2016-05-31 DIAGNOSIS — I10 Essential (primary) hypertension: Secondary | ICD-10-CM

## 2016-05-31 DIAGNOSIS — M5440 Lumbago with sciatica, unspecified side: Secondary | ICD-10-CM | POA: Diagnosis not present

## 2016-05-31 DIAGNOSIS — E785 Hyperlipidemia, unspecified: Secondary | ICD-10-CM

## 2016-05-31 LAB — COMPLETE METABOLIC PANEL WITH GFR
ALBUMIN: 4.3 g/dL (ref 3.6–5.1)
ALK PHOS: 68 U/L (ref 40–115)
ALT: 30 U/L (ref 9–46)
AST: 24 U/L (ref 10–35)
BUN: 13 mg/dL (ref 7–25)
CALCIUM: 9.6 mg/dL (ref 8.6–10.3)
CO2: 27 mmol/L (ref 20–31)
Chloride: 100 mmol/L (ref 98–110)
Creat: 1.01 mg/dL (ref 0.70–1.33)
GFR, EST NON AFRICAN AMERICAN: 86 mL/min (ref >=60)
GFR, Est African American: >89 mL/min (ref >=60)
Glucose, Bld: 102 mg/dL — ABNORMAL HIGH (ref 65–99)
POTASSIUM: 4.1 mmol/L (ref 3.5–5.3)
SODIUM: 135 mmol/L (ref 135–146)
Total Bilirubin: 0.8 mg/dL (ref 0.2–1.2)
Total Protein: 7.5 g/dL (ref 6.1–8.1)

## 2016-05-31 LAB — LIPID PANEL
CHOL/HDL RATIO: 3.8 ratio (ref <=5.0)
CHOLESTEROL: 249 mg/dL — AB (ref 125–200)
HDL: 66 mg/dL (ref >=40)
LDL Cholesterol: 137 mg/dL — ABNORMAL HIGH (ref <130)
TRIGLYCERIDES: 228 mg/dL — AB (ref <150)
VLDL: 46 mg/dL — ABNORMAL HIGH (ref <30)

## 2016-05-31 MED ORDER — ATORVASTATIN CALCIUM 10 MG PO TABS: 10.0000 mg | ORAL_TABLET | Freq: Every day | ORAL | Status: DC

## 2016-05-31 NOTE — Patient Instructions (Signed)
Chronic Back Pain  When back pain lasts longer than 3 months, it is called chronic back pain.People with chronic back pain often go through certain periods that are more intense (flare-ups).  CAUSES Chronic back pain can be caused by wear and tear (degeneration) on different structures in your back. These structures include:  The bones of your spine (vertebrae) and the joints surrounding your spinal cord and nerve roots (facets).  The strong, fibrous tissues that connect your vertebrae (ligaments). Degeneration of these structures may result in pressure on your nerves. This can lead to constant pain. HOME CARE INSTRUCTIONS  Avoid bending, heavy lifting, prolonged sitting, and activities which make the problem worse.  Take brief periods of rest throughout the day to reduce your pain. Lying down or standing usually is better than sitting while you are resting.  Take over-the-counter or prescription medicines only as directed by your caregiver. SEEK IMMEDIATE MEDICAL CARE IF:   You have weakness or numbness in one of your legs or feet.  You have trouble controlling your bladder or bowels.  You have nausea, vomiting, abdominal pain, shortness of breath, or fainting.   This information is not intended to replace advice given to you by your health care provider. Make sure you discuss any questions you have with your health care provider.   Document Released: 01/17/2005 Document Revised: 03/03/2012 Document Reviewed: 05/30/2015 Elsevier Interactive Patient Education 2016 Elsevier Inc. DASH Eating Plan DASH stands for "Dietary Approaches to Stop Hypertension." The DASH eating plan is a healthy eating plan that has been shown to reduce high blood pressure (hypertension). Additional health benefits may include reducing the risk of type 2 diabetes mellitus, heart disease, and stroke. The DASH eating plan may also help with weight loss. WHAT DO I NEED TO KNOW ABOUT THE DASH EATING PLAN? For the  DASH eating plan, you will follow these general guidelines:  Choose foods with a percent daily value for sodium of less than 5% (as listed on the food label).  Use salt-free seasonings or herbs instead of table salt or sea salt.  Check with your health care provider or pharmacist before using salt substitutes.  Eat lower-sodium products, often labeled as "lower sodium" or "no salt added."  Eat fresh foods.  Eat more vegetables, fruits, and low-fat dairy products.  Choose whole grains. Look for the word "whole" as the first word in the ingredient list.  Choose fish and skinless chicken or [place] more often than red meat. Limit fish, poultry, and meat to 6 oz (170 g) each day.  Limit sweets, desserts, sugars, and sugary drinks.  Choose heart-healthy fats.  Limit cheese to 1 oz (28 g) per day.  Eat more home-cooked food and less restaurant, buffet, and fast food.  Limit fried foods.  Cook foods using methods other than frying.  Limit canned vegetables. If you do use them, rinse them well to decrease the sodium.  When eating at a restaurant, ask that your food be prepared with less salt, or no salt if possible. WHAT FOODS CAN I EAT? Seek help from a dietitian for individual calorie needs. Grains Whole grain or whole wheat bread. Brown rice. Whole grain or whole wheat pasta. Quinoa, bulgur, and whole grain cereals. Low-sodium cereals. Corn or whole wheat flour tortillas. Whole grain cornbread. Whole grain crackers. Low-sodium crackers. Vegetables Fresh or frozen vegetables (raw, steamed, roasted, or grilled). Low-sodium or reduced-sodium tomato and vegetable juices. Low-sodium or reduced-sodium tomato sauce and paste. Low-sodium or reduced-sodium canned vegetables.  Fruits All fresh, canned (in natural juice), or frozen fruits. Meat and Other Protein Products Ground beef (85% or leaner), grass-fed beef, or beef trimmed of fat. Skinless chicken or [place]. Ground chicken or [place].  Pork trimmed of fat. All fish and seafood. Eggs. Dried beans, peas, or lentils. Unsalted nuts and seeds. Unsalted canned beans. Dairy Low-fat dairy products, such as skim or 1% milk, 2% or reduced-fat cheeses, low-fat ricotta or cottage cheese, or plain low-fat yogurt. Low-sodium or reduced-sodium cheeses. Fats and Oils Tub margarines without trans fats. Light or reduced-fat mayonnaise and salad dressings (reduced sodium). Avocado. Safflower, olive, or canola oils. Natural peanut or almond butter. Other Unsalted popcorn and pretzels. The items listed above may not be a complete list of recommended foods or beverages. Contact your dietitian for more options. WHAT FOODS ARE NOT RECOMMENDED? Grains White bread. White pasta. White rice. Refined cornbread. Bagels and croissants. Crackers that contain trans fat. Vegetables Creamed or fried vegetables. Vegetables in a cheese sauce. Regular canned vegetables. Regular canned tomato sauce and paste. Regular tomato and vegetable juices. Fruits Dried fruits. Canned fruit in light or heavy syrup. Fruit juice. Meat and Other Protein Products Fatty cuts of meat. Ribs, chicken wings, bacon, sausage, bologna, salami, chitterlings, fatback, hot dogs, bratwurst, and packaged luncheon meats. Salted nuts and seeds. Canned beans with salt. Dairy Whole or 2% milk, cream, half-and-half, and cream cheese. Whole-fat or sweetened yogurt. Full-fat cheeses or blue cheese. Nondairy creamers and whipped toppings. Processed cheese, cheese spreads, or cheese curds. Condiments Onion and garlic salt, seasoned salt, table salt, and sea salt. Canned and packaged gravies. Worcestershire sauce. Tartar sauce. Barbecue sauce. Teriyaki sauce. Soy sauce, including reduced sodium. Steak sauce. Fish sauce. Oyster sauce. Cocktail sauce. Horseradish. Ketchup and mustard. Meat flavorings and tenderizers. Bouillon cubes. Hot sauce. Tabasco sauce. Marinades. Taco seasonings. Relishes. Fats and  Oils Butter, stick margarine, lard, shortening, ghee, and bacon fat. Coconut, palm kernel, or palm oils. Regular salad dressings. Other Pickles and olives. Salted popcorn and pretzels. The items listed above may not be a complete list of foods and beverages to avoid. Contact your dietitian for more information. WHERE CAN I FIND MORE INFORMATION? National Heart, Lung, and Blood Institute: [employer]   This information is not intended to replace advice given to you by your health care provider. Make sure you discuss any questions you have with your health care provider.   Document Released: 11/29/2011 Document Revised: 12/31/2014 Document Reviewed: 10/14/2013 Elsevier Interactive Patient Education [employer]

## 2016-05-31 NOTE — Progress Notes (Signed)
Subjective:    Patient ID: Troy Elliott, male    DOB: 10/01/1966, 50 y.o.   MRN: [id number]  HPI  Mr. Troy Elliott, a 50 year old male with a history of hypertension presents for a follow up of hypertension.  He states that he has not been taking medications consistently.  He [place] in [place] and re-started 2 days ago. He maintains that he has been an every day smoker for the past 30 years. He smokes 1 pack every other day. Patient denies dizziness, fatigue, chest pains, shortness of breath, heart palpitations, near syncope, or lower extremity edema.   He states that pain is primarily to lower back intermittently.  He maintains that he was followed by pain management prior to relocating to [facility]. He states that pain was controlled previously on Oxycodone 5 mg every 6 hours as needed. Troy Elliott and I discussed the fact that we will not prescribe chronic opiate medications in this office, patient expressed understanding. He states that he has not attempted any OTC interventions to alleviate current symptoms. He describes pain as burning and shooting. He was evaluated in the emergency department for back pain previously and given a  Toradol injection with unsatisfactory relief. He denies fever, fatigue, or incontinence of urine/stool.   Past Medical History  Diagnosis Date  . Hypertension   . Asthma    Social History   Social History  . Marital Status: Single    Spouse Name: N/A  . Number of Children: N/A  . Years of Education: N/A   Occupational History  . Not on file.   Social History Main Topics  . Smoking status: Current Every Day Smoker -- 0.50 packs/day    Types: Cigarettes  . Smokeless tobacco: Not on file  . Alcohol Use: Yes     Comment: 1 or 2 beers dialy  . Drug Use: No  . Sexual Activity: Not on file   Other Topics Concern  . Not on file   Social History Narrative   Allergies  Allergen Reactions  . Dilantin [Phenytoin Sodium  Extended] Hives and Rash    Review of Systems  Constitutional: Negative.  Negative for malaise/fatigue and fatigue.  HENT: Negative.   Eyes: Negative.  Negative for blurred vision, photophobia and visual disturbance.  Respiratory: Negative.  Negative for shortness of breath.   Cardiovascular: Negative.  Negative for chest pain, palpitations and orthopnea.  Gastrointestinal: Negative.   Endocrine: Negative.  Negative for polydipsia, polyphagia and polyuria.  Genitourinary: Negative.   Musculoskeletal: Positive for back pain. Negative for neck pain.  Skin: Negative.   Allergic/Immunologic: Negative.   Neurological: Negative.  Negative for headaches.  Hematological: Negative.   Psychiatric/Behavioral: Negative.  Negative for suicidal ideas and sleep disturbance.       Objective:   Physical Exam  Constitutional: He is oriented to person, place, and time. He appears well-developed and well-nourished.  HENT:  Head: Normocephalic and atraumatic.  Right Ear: External ear normal.  Left Ear: External ear normal.  Mouth/Throat: Oropharynx is clear and moist.  Eyes: Conjunctivae and EOM are normal. Pupils are equal, round, and reactive to light.  Neck: Normal range of motion. Neck supple.  Cardiovascular: Normal rate, normal heart sounds and intact distal pulses.   Pulmonary/Chest: Effort normal and breath sounds normal.  Abdominal: Soft. Bowel sounds are normal.  Musculoskeletal:       Lumbar back: He exhibits decreased range of motion and pain. He exhibits no bony tenderness, no  swelling and no spasm.  Neurological: He is alert and oriented to person, place, and time. He has normal reflexes.  Skin: Skin is warm and dry.  Psychiatric: He has a normal mood and affect. His behavior is normal. Judgment and thought content normal.      BP 160/98 mmHg  Pulse 73  Temp(Src) 98.3 F (36.8 C) (Oral)  Resp 16  Ht 5\' 11"  (1.803 m)  Wt 201 lb (91.173 kg)  BMI 28.05 kg/m2  SpO2  97% Assessment & Plan:   1. Hyperlipidemia The patient is asked to make an attempt to improve diet and exercise patterns to aid in medical management of this problem. - atorvastatin (LIPITOR) 10 MG tablet; Take 1 tablet (10 mg total) by mouth daily.  Dispense: 30 tablet; Refill: 2 - Lipid Panel  2. Uncontrolled hypertension Blood pressure is not at goal on current medication regimen. There have been some probable compliance issues here. I have discussed with him the great importance of following the treatment plan exactly as directed in order to achieve a good medical outcome. - COMPLETE METABOLIC PANEL WITH GFR  3. Chronic back pain Recommend warm, moist compresses to lower back, use interchangeably with cool compresses for 20 minutes 4 times per day.  Refrain from lifting items that are greater than 25 pounds Recommend Tylenol 500 mg every 6 hours for mild to moderate pain - DG Lumbar Spine Complete; Future  4. Tobacco dependence Smoking cessation instruction/counseling given:  counseled patient on the dangers of tobacco use, advised patient to stop smoking, and reviewed strategies to maximize success   RTC: 3 months for hypertension    Shakim Faith M, FNP    The patient was given clear instructions to go to ER or return to medical center if symptoms do not improve, worsen or new problems develop. The patient verbalized understanding. Will notify patient with laboratory results.

## 2016-06-01 ENCOUNTER — Telehealth: Payer: Self-pay

## 2016-06-01 NOTE — Telephone Encounter (Signed)
Called and spoke with patient, advised of normal xray and [place]'s recommendations. Patient verbalized understanding. Thanks!

## 2016-06-01 NOTE — Telephone Encounter (Signed)
Patient returned call and I advised of labs and to increase cholesterol medication to 20mg  daily with dinner and continue to work on diet and exercise. Appointment is scheduled for September will keep that appointment. Thanks!

## 2016-06-01 NOTE — Telephone Encounter (Signed)
-----   Message from Massie MaroonLachina M Hollis, [place] sent at 05/31/2016  5:13 PM EDT ----- Regarding: lab results Please inform Mr. Mickle MalloryDonnell that spinal xray was negative. Continue to use heating pad, use interchangeably with cool compresses. Also, use Tylenol 500 mg every 6 hours as needed for mild to moderate pain. Please follow up in office as scheduled.   Thanks,   ----- Message -----    From: Rad Results In Interface    Sent: 05/31/2016   2:32 PM      To: Massie MaroonLachina M Hollis, FNP

## 2016-06-01 NOTE — Telephone Encounter (Signed)
Called no answer , left message for patient to call back regarding labs. Thanks!

## 2016-06-01 NOTE — Telephone Encounter (Signed)
-----   Message from Massie MaroonLachina M Hollis, [place] sent at 06/01/2016 10:53 AM EDT ----- Regarding: lab results Please inform Mr. Troy Elliott that cholesterol results were markedly elevated. Will increase atorvastatin to 20 mg with dinner. Recommend a lowfat, low carbohydrate diet divided over 5-6 small meals, increase water intake to 6-8 glasses, and 150 minutes per week of cardiovascular exercise.  Will follow up for hypertension in 3 months as scheduled  Thanks  ----- Message -----    From: Lab in [facility] Interface    Sent: 05/31/2016  11:18 PM      To: Massie MaroonLachina M Hollis, FNP

## 2016-07-02 MED FILL — ?ATORVASTATIN 10 MG TABLET: 10 | 30 days supply | Qty: 30 | Fill #0

## 2016-07-25 ENCOUNTER — Other Ambulatory Visit: Payer: Self-pay | Admitting: Family Medicine

## 2016-07-25 DIAGNOSIS — I10 Essential (primary) hypertension: Secondary | ICD-10-CM

## 2016-07-25 MED FILL — ?MONTELUKAST SOD 10 MG TAB: 10 | 30 days supply | Qty: 30 | Fill #3

## 2016-07-25 MED FILL — ?ATORVASTATIN 10 MG TABLET: 10 | 30 days supply | Qty: 30 | Fill #1

## 2016-07-25 MED FILL — LOSARTAN POTASSIUM 50 MG TA: 50 | 30 days supply | Qty: 60 | Fill #1

## 2016-07-26 MED FILL — HYDROCHLOROTHIAZIDE 25 MG T: 25 | 30 days supply | Qty: 30 | Fill #0

## 2016-07-26 MED FILL — ?AMLODIPINE BESYLATE 10 MG: 10 | 30 days supply | Qty: 30 | Fill #0

## 2016-09-03 ENCOUNTER — Encounter: Payer: Self-pay | Admitting: Family Medicine

## 2016-09-03 ENCOUNTER — Encounter: Payer: Self-pay | Admitting: Gastroenterology

## 2016-09-03 ENCOUNTER — Ambulatory Visit (INDEPENDENT_AMBULATORY_CARE_PROVIDER_SITE_OTHER): Payer: Medicaid Other | Admitting: Family Medicine

## 2016-09-03 VITALS — BP 164/97 | HR 72 | Temp 98.0°F | Ht 70.0 in | Wt 200.0 lb

## 2016-09-03 DIAGNOSIS — Z131 Encounter for screening for diabetes mellitus: Secondary | ICD-10-CM | POA: Diagnosis not present

## 2016-09-03 DIAGNOSIS — Z1211 Encounter for screening for malignant neoplasm of colon: Secondary | ICD-10-CM

## 2016-09-03 DIAGNOSIS — J4520 Mild intermittent asthma, uncomplicated: Secondary | ICD-10-CM

## 2016-09-03 DIAGNOSIS — Z1159 Encounter for screening for other viral diseases: Secondary | ICD-10-CM

## 2016-09-03 DIAGNOSIS — E785 Hyperlipidemia, unspecified: Secondary | ICD-10-CM

## 2016-09-03 DIAGNOSIS — F17200 Nicotine dependence, unspecified, uncomplicated: Secondary | ICD-10-CM

## 2016-09-03 DIAGNOSIS — Z23 Encounter for immunization: Secondary | ICD-10-CM | POA: Diagnosis not present

## 2016-09-03 DIAGNOSIS — I10 Essential (primary) hypertension: Secondary | ICD-10-CM

## 2016-09-03 DIAGNOSIS — R21 Rash and other nonspecific skin eruption: Secondary | ICD-10-CM

## 2016-09-03 LAB — CBC WITH DIFFERENTIAL/PLATELET
BASOS ABS: 0 {cells}/uL (ref 0–200)
Basophils Relative: 0 %
EOS ABS: 162 {cells}/uL (ref 15–500)
Eosinophils Relative: 3 %
HCT: 43.1 % (ref 38.5–50.0)
Hemoglobin: 14.7 g/dL (ref 13.2–17.1)
LYMPHS PCT: 41 %
Lymphs Abs: 2214 cells/uL (ref 850–3900)
MCH: 31.3 pg (ref 27.0–33.0)
MCHC: 34.1 g/dL (ref 32.0–36.0)
MCV: 91.9 fL (ref 80.0–100.0)
MONOS PCT: 11 %
MPV: 8.5 fL (ref 7.5–12.5)
Monocytes Absolute: 594 cells/uL (ref 200–950)
Neutro Abs: 2430 cells/uL (ref 1500–7800)
Neutrophils Relative %: 45 %
PLATELETS: 242 10*3/uL (ref 140–400)
RBC: 4.69 MIL/uL (ref 4.20–5.80)
RDW: 13.6 % (ref 11.0–15.0)
WBC: 5.4 10*3/uL (ref 3.8–10.8)

## 2016-09-03 LAB — HEPATITIS C ANTIBODY: HCV Ab: NEGATIVE

## 2016-09-03 MED ORDER — KETOCONAZOLE 2 % EX CREA: TOPICAL_CREAM | Freq: Two times a day (BID) | CUTANEOUS | Status: DC

## 2016-09-03 MED ORDER — ATORVASTATIN CALCIUM 10 MG PO TABS: 10.0000 mg | ORAL_TABLET | Freq: Every day | ORAL | 2 refills | Status: DC

## 2016-09-03 MED ORDER — LOSARTAN POTASSIUM 50 MG PO TABS: 50.0000 mg | ORAL_TABLET | Freq: Two times a day (BID) | ORAL | 2 refills | Status: DC

## 2016-09-03 MED ORDER — AMLODIPINE BESYLATE 10 MG PO TABS: 10.0000 mg | ORAL_TABLET | Freq: Every day | ORAL | 2 refills | Status: DC

## 2016-09-03 MED ORDER — MONTELUKAST SODIUM 10 MG PO TABS: 10.0000 mg | ORAL_TABLET | Freq: Every day | ORAL | 3 refills | Status: AC

## 2016-09-03 MED ORDER — HYDROCHLOROTHIAZIDE 25 MG PO TABS: 25.0000 mg | ORAL_TABLET | Freq: Every day | ORAL | 2 refills | Status: DC

## 2016-09-04 LAB — HEMOGLOBIN A1C
Hgb A1c MFr Bld: 6.2 % — ABNORMAL HIGH (ref <5.7)
Mean Plasma Glucose: 131 mg/dL

## 2016-09-04 MED FILL — AMLODIPINE BESYLATE 10 MG T: 10 | 30 days supply | Qty: 30 | Fill #0

## 2016-09-04 MED FILL — HYDROCHLOROTHIAZIDE 25 MG T: 25 | 30 days supply | Qty: 30 | Fill #0

## 2016-09-04 MED FILL — LOSARTAN POTASSIUM 50 MG TA: 50 | 30 days supply | Qty: 60 | Fill #0

## 2016-09-04 MED FILL — ATORVASTATIN 10 MG TABLET: 10 | 30 days supply | Qty: 30 | Fill #0

## 2016-09-04 MED FILL — MONTELUKAST SOD 10 MG TAB: 10 | 30 days supply | Qty: 30 | Fill #0

## 2016-09-05 NOTE — Progress Notes (Signed)
Troy HellingMichael Elliott, is a 50 y.o. male  ZOX:[id number]:[id number]  [id number]:[id number]  DOB - 04/30/1966  CC:  Chief Complaint  Patient presents with  . Hypertension    here for blood pressure follow up, needs refills on all meds, has had URI and has lingering coughm thinks he may need antibiotic, also has irritation around boxer line thinks its jock itch       HPI: Troy Elliott is a 50 y.o. male here for follow-up chronic conditions. He has a history of hypertension and asthma and hypercholesterolemia.  He is needing refills on his medications, speciffically losartan, hctz, amlodipine, atorvastatin and singular.  He complains today of a rash around the waist band of his underwear and pants.  Health Maintenance:He accepts a Tdap today, but declines influenza. He needs a colon cancer screening   Allergies  Allergen Reactions  . Dilantin [Phenytoin Sodium Extended] Hives and Rash   Past Medical History:  Diagnosis Date  . Asthma   . Hypertension    Current Outpatient Prescriptions on File Prior to Visit  Medication Sig Dispense Refill  . amitriptyline (ELAVIL) 75 MG tablet Take 75 mg by mouth at bedtime.    Marland Kitchen. FLOVENT HFA 110 MCG/ACT inhaler INHALE 1 PUFF INTO THE LUNGS 2 TIMES DAILY. 1 Inhaler 2  . fluticasone (FLONASE) 50 MCG/ACT nasal spray Place 1 spray into both nostrils daily as needed for allergies or rhinitis. Reported on 12/07/2015 16 g 0  . gabapentin (NEURONTIN) 100 MG capsule Take 1 capsule (100 mg total) by mouth 3 (three) times daily. 90 capsule 0  . loratadine (CLARITIN) 10 MG tablet Take 10 mg by mouth daily as needed for allergies. Reported on 12/07/2015    . prazosin (MINIPRESS) 1 MG capsule Take 2 mg by mouth at bedtime. Reported on 02/29/2016    . VENTOLIN HFA 108 (90 Base) MCG/ACT inhaler INHALE 2 PUFFS INTO THE LUNGS EVERY 6 HOURS AS NEEDED FOR WHEEZING OR SHORTNESS OF BREATH. 18 g 0  . DULoxetine (CYMBALTA) 60 MG capsule Take 60 mg by mouth daily. Reported on 05/31/2016    .  oxyCODONE (OXY IR/ROXICODONE) 5 MG immediate release tablet Take 5 mg by mouth every 6 (six) hours as needed for severe pain. Reported on 05/31/2016     No current facility-administered medications on file prior to visit.    Family History  Problem Relation Age of Onset  . Diabetes Mother   . Diabetes Sister   . Diabetes Maternal Aunt    Social History   Social History  . Marital status: Single    Spouse name: N/A  . Number of children: N/A  . Years of education: N/A   Occupational History  . Not on file.   Social History Main Topics  . Smoking status: Current Every Day Smoker    Packs/day: 0.50    Types: Cigarettes  . Smokeless tobacco: Never Used  . Alcohol use Yes     Comment: 1 or 2 beers dialy  . Drug use: No  . Sexual activity: Not on file   Other Topics Concern  . Not on file   Social History Narrative  . No narrative on file    Review of Systems: Constitutional: Negative Skin: Positive for itchy rash on lower abdomen HENT: Negative  Eyes: Negative  Neck: Negative Respiratory: Some shortness of breath, coughing, wheezing related to asthma flares Cardiovascular: Negative Gastrointestinal: Occ hearburn and upper abd pain Genitourinary: Negative  Musculoskeletal: Positive for shoulder and knee pain Neurological: Negative  for Hematological: Negative  Psychiatric/Behavioral: Negative    Objective:   Vitals:   09/03/16 1013  BP: (!) 164/97  Pulse: 72  Temp: 98 F (36.7 C)    Physical Exam: Constitutional: Patient appears well-developed and well-nourished. No distress. HENT: Normocephalic, atraumatic, External right and left ear normal. Oropharynx is clear and moist.  Eyes: Conjunctivae and EOM are normal. PERRLA, no scleral icterus. Neck: Normal ROM. Neck supple. No lymphadenopathy, No thyromegaly. CVS: RRR, S1/S2 +, no murmurs, no gallops, no rubs Pulmonary: Effort and breath sounds normal, no stridor, rhonchi, wheezes, rales.  Abdominal: Soft.  Normoactive BS,, no distension, tenderness, rebound or guarding.  Musculoskeletal: Normal range of motion. No edema and no tenderness.  Neuro: Alert.Normal muscle tone coordination. Non-focal Skin: Skin is warm and dry.  Not diaphoretic. No erythema. No pallor.There is a line of scaley rash just below waistline. Psychiatric: Normal mood and affect. Behavior, judgment, thought content normal.  Lab Results  Component Value Date   WBC 5.4 09/03/2016   HGB 14.7 09/03/2016   HCT 43.1 09/03/2016   MCV 91.9 09/03/2016   PLT 242 09/03/2016   Lab Results  Component Value Date   CREATININE 1.01 05/31/2016   BUN 13 05/31/2016   NA 135 05/31/2016   K 4.1 05/31/2016   CL 100 05/31/2016   CO2 27 05/31/2016    Lab Results  Component Value Date   HGBA1C 6.2 (H) 09/03/2016   Lipid Panel     Component Value Date/Time   CHOL 249 (H) 05/31/2016 1041   TRIG 228 (H) 05/31/2016 1041   HDL 66 05/31/2016 1041   CHOLHDL 3.8 05/31/2016 1041   VLDL 46 (H) 05/31/2016 1041   LDLCALC 137 (H) 05/31/2016 1041       Assessment and plan:   1. Tobacco dependence Advised to quit.  2. Essential hypertension  - CBC with Differential - losartan (COZAAR) 50 MG tablet; Take 1 tablet (50 mg total) by mouth 2 (two) times daily.  Dispense: 60 tablet; Refill: 2  3. Screening for diabetes mellitus  - Hemoglobin A1c  4. Need for hepatitis C screening test  - Hepatitis C Antibody  5. Screening for colon cancer - Ambulatory referral to Gastroenterology  6. Asthma, mild intermittent, uncomplicated  - montelukast (SINGULAIR) 10 MG tablet; Take 1 tablet (10 mg total) by mouth at bedtime.  Dispense: 30 tablet; Refill: 3  7. Hyperlipidemia  - atorvastatin (LIPITOR) 10 MG tablet; Take 1 tablet (10 mg total) by mouth daily.  Dispense: 30 tablet; Refill: 2  8. Uncontrolled hypertension  - amLODipine (NORVASC) 10 MG tablet; Take 1 tablet (10 mg total) by mouth daily.  Dispense: 30 tablet; Refill: 2 -  hydrochlorothiazide (HYDRODIURIL) 25 MG tablet; Take 1 tablet (25 mg total) by mouth daily.  Dispense: 30 tablet; Refill: 2  9. Rash and nonspecific skin eruption  - ketoconazole (NIZORAL) 2 % cream; Apply topically 2 (two) times daily.   No Follow-up on file.  The patient was given clear instructions to go to ER or return to medical center if symptoms don't improve, worsen or new problems develop. The patient verbalized understanding.    Henrietta Hoover FNP  09/05/2016, 12:19 PM

## 2016-09-06 ENCOUNTER — Other Ambulatory Visit: Payer: Self-pay | Admitting: Family Medicine

## 2016-09-06 ENCOUNTER — Telehealth: Payer: Self-pay

## 2016-09-06 NOTE — Telephone Encounter (Signed)
Patient returned call and I advised of hgba1c and to avoid sweets and carbs in diet. Patient verbalized understanding. Thanks

## 2016-09-06 NOTE — Telephone Encounter (Signed)
Called, no answer. Left message for patient to call back. Thanks!  

## 2016-09-06 NOTE — Telephone Encounter (Signed)
-----   Message from Henrietta HooverLinda C Bernhardt, NP sent at 09/06/2016  9:51 AM EDT ----- A1C 6.2 indicting prediabetes. Careful with sweets and other carbs in diet. Try to exercise regularly. Recheck in 6  Months.

## 2016-09-10 ENCOUNTER — Emergency Department (HOSPITAL_COMMUNITY): Payer: Medicaid Other

## 2016-09-10 ENCOUNTER — Emergency Department (HOSPITAL_COMMUNITY)
Admission: EM | Admit: 2016-09-10 | Discharge: 2016-09-10 | Disposition: A | Discharge status: Home / Self Care | Payer: Medicaid Other | Attending: Emergency Medicine | Admitting: Emergency Medicine

## 2016-09-10 ENCOUNTER — Encounter (HOSPITAL_COMMUNITY): Payer: Self-pay | Admitting: Vascular Surgery

## 2016-09-10 DIAGNOSIS — I10 Essential (primary) hypertension: Secondary | ICD-10-CM | POA: Diagnosis not present

## 2016-09-10 DIAGNOSIS — F17210 Nicotine dependence, cigarettes, uncomplicated: Secondary | ICD-10-CM | POA: Diagnosis not present

## 2016-09-10 DIAGNOSIS — J45909 Unspecified asthma, uncomplicated: Secondary | ICD-10-CM | POA: Insufficient documentation

## 2016-09-10 DIAGNOSIS — M25511 Pain in right shoulder: Secondary | ICD-10-CM | POA: Diagnosis not present

## 2016-09-10 DIAGNOSIS — Z79899 Other long term (current) drug therapy: Secondary | ICD-10-CM | POA: Insufficient documentation

## 2016-09-10 MED ORDER — CYCLOBENZAPRINE HCL 10 MG PO TABS
5.0000 mg | ORAL_TABLET | Freq: Once | ORAL | Status: AC
  Administered 2016-09-10: 5 mg via ORAL
  Filled 2016-09-10: qty 1

## 2016-09-10 MED ORDER — HYDROCODONE-ACETAMINOPHEN 5-325 MG PO TABS
1.0000 | ORAL_TABLET | Freq: Once | ORAL | Status: AC
  Administered 2016-09-10: 1 via ORAL
  Filled 2016-09-10: qty 1

## 2016-09-10 MED ORDER — CYCLOBENZAPRINE HCL 5 MG PO TABS: 5.0000 mg | ORAL_TABLET | Freq: Three times a day (TID) | ORAL | 0 refills | Status: DC | PRN

## 2016-09-10 NOTE — ED Provider Notes (Signed)
[facility]-EMERGENCY DEPT Provider Note   CSN: [id number] Arrival date & time: 09/10/16  1435  By signing my name below, I, Phillis HaggisGabriella Gaje, attest that this documentation has been prepared under the direction and in the presence of Arvilla MeresAshley Meyer, PA-C. Electronically Signed: Phillis HaggisGabriella Gaje, ED Scribe. 09/10/16. 6:18 PM.  History   Chief Complaint Chief Complaint  Patient presents with  . Shoulder Pain   The history is provided by the patient. No language interpreter was used.   HPI Comments: Troy Elliott is a 50 y.o. Male with a hx of HTN and shoulder surgery who presents to the Emergency Department complaining of gradually worsening right shoulder pain onset one day ago. Pt reports worsening pain with movement of the shoulder. He states that he was throwing trash into a garbage can when he swung the bag upward and hurt his shoulder. He is able to move his arm but states it is painful. He has not taken anything for pain. He denies recent fall, fever, chills, warmth or swelling to the area, numbness or weakness. Pt does not have an orthopedist at this time.   Past Medical History:  Diagnosis Date  . Asthma   . Hypertension     Patient Active Problem List   Diagnosis Date Noted  . Chronic back pain 02/29/2016  . Essential hypertension 12/07/2015  . Wrist pain 12/07/2015  . Tendonitis of wrist, right 12/07/2015  . History of carpal tunnel syndrome 12/07/2015  . Seasonal allergies 12/07/2015  . Hyperlipidemia 11/16/2015  . Uncontrolled hypertension 11/03/2015  . Tobacco dependence 11/03/2015    Past Surgical History:  Procedure Laterality Date  . APPENDECTOMY    . KNEE SURGERY Left   . SHOULDER SURGERY       Home Medications    Prior to Admission medications   Medication Sig Start Date End Date Taking? Authorizing Provider  amitriptyline (ELAVIL) 75 MG tablet Take 75 mg by mouth at bedtime.    Historical Provider, MD  amLODipine (NORVASC) 10 MG tablet Take 1 tablet (10 mg  total) by mouth daily. 09/03/16   Henrietta HooverLinda C Bernhardt, NP  atorvastatin (LIPITOR) 10 MG tablet Take 1 tablet (10 mg total) by mouth daily. 09/03/16   Henrietta HooverLinda C Bernhardt, NP  cyclobenzaprine (FLEXERIL) 5 MG tablet Take 1 tablet (5 mg total) by mouth 3 (three) times daily as needed for muscle spasms. 09/10/16   Lona KettleAshley Laurel Meyer, PA-C  DULoxetine (CYMBALTA) 60 MG capsule Take 60 mg by mouth daily. Reported on 05/31/2016    Historical Provider, MD  FLOVENT HFA 110 MCG/ACT inhaler INHALE 1 PUFF INTO THE LUNGS 2 TIMES DAILY. 05/10/16   Massie MaroonLachina M Hollis, FNP  fluticasone (FLONASE) 50 MCG/ACT nasal spray Place 1 spray into both nostrils daily as needed for allergies or rhinitis. Reported on 12/07/2015 12/07/15   Massie MaroonLachina M Hollis, FNP  gabapentin (NEURONTIN) 100 MG capsule Take 1 capsule (100 mg total) by mouth 3 (three) times daily. 12/07/15   Massie MaroonLachina M Hollis, FNP  hydrochlorothiazide (HYDRODIURIL) 25 MG tablet Take 1 tablet (25 mg total) by mouth daily. 09/03/16   Henrietta HooverLinda C Bernhardt, NP  loratadine (CLARITIN) 10 MG tablet Take 10 mg by mouth daily as needed for allergies. Reported on 12/07/2015    Historical Provider, MD  losartan (COZAAR) 50 MG tablet Take 1 tablet (50 mg total) by mouth 2 (two) times daily. 09/03/16   Henrietta HooverLinda C Bernhardt, NP  montelukast (SINGULAIR) 10 MG tablet Take 1 tablet (10 mg total) by mouth at bedtime.  09/03/16   Henrietta Hoover, NP  oxyCODONE (OXY IR/ROXICODONE) 5 MG immediate release tablet Take 5 mg by mouth every 6 (six) hours as needed for severe pain. Reported on 05/31/2016    Historical Provider, MD  prazosin (MINIPRESS) 1 MG capsule Take 2 mg by mouth at bedtime. Reported on 02/29/2016    Historical Provider, MD  VENTOLIN HFA 108 (90 Base) MCG/ACT inhaler INHALE 2 PUFFS INTO THE LUNGS EVERY 6 HOURS AS NEEDED FOR WHEEZING OR SHORTNESS OF BREATH. 05/10/16   Massie Maroon, FNP    Family History Family History  Problem Relation Age of Onset  . Diabetes Mother   . Diabetes Sister   .  Diabetes Maternal Aunt     Social History Social History  Substance Use Topics  . Smoking status: Current Every Day Smoker    Packs/day: 0.50    Types: Cigarettes  . Smokeless tobacco: Never Used  . Alcohol use Yes     Comment: 1 or 2 beers dialy     Allergies   Dilantin [phenytoin sodium extended]   Review of Systems Review of Systems  Constitutional: Negative for chills and fever.  Gastrointestinal: Negative for vomiting.  Musculoskeletal: Positive for arthralgias. Negative for joint swelling and myalgias.  Skin: Negative for color change, rash and wound.  Neurological: Negative for weakness and numbness.   Physical Exam Updated Vital Signs BP 147/86 (BP Location: Right Arm)   Pulse 93   Temp 97.9 F (36.6 C) (Oral)   Resp 16   SpO2 99%   Physical Exam  Constitutional: He appears well-developed and well-nourished. No distress.  HENT:  Head: Normocephalic and atraumatic.  Eyes: Conjunctivae and EOM are normal. No scleral icterus.  Neck: Normal range of motion. Neck supple.  Cardiovascular: Normal rate.   Pulses:      Radial pulses are 2+ on the right side, and 2+ on the left side.  Pulmonary/Chest: Effort normal. No respiratory distress.  Musculoskeletal:       Right shoulder: He exhibits decreased range of motion, tenderness and pain. He exhibits no bony tenderness, no swelling, no deformity and normal strength.  No obvious deformity. No warmth, erythema, or swelling appreciated. Tenderness of the right shoulder, decreased active ROM, improved with passive ROM, however, not full ROM. Pain with ROM. Strength 4/5 in right UE with pushing mechanism, may be secondary to pain. Grip strength symmetric and strong. 2+ radial pulses. Sensation intact.   Neurological: He is alert. He is not disoriented. No sensory deficit. Coordination and gait normal. GCS eye subscore is 4. GCS verbal subscore is 5. GCS motor subscore is 6.  Skin: Skin is warm and dry. He is not  diaphoretic.  Psychiatric: He has a normal mood and affect. His behavior is normal.  Nursing note and vitals reviewed.   ED Treatments / Results  DIAGNOSTIC STUDIES: Oxygen Saturation is 99% on RA, normal by my interpretation.    COORDINATION OF CARE: 6:17 PM-Discussed treatment plan which includes x-ray, RICE protocol, anti-inflammatories, and sling with pt at bedside and pt agreed to plan.   Labs (all labs ordered are listed, but only abnormal results are displayed) Labs Reviewed - No data to display  EKG  EKG Interpretation None       Radiology No results found.  Procedures Procedures (including critical care time)  Medications Ordered in ED Medications  HYDROcodone-acetaminophen (NORCO/VICODIN) 5-325 MG per tablet 1 tablet (1 tablet Oral Given 09/10/16 1836)  cyclobenzaprine (FLEXERIL) tablet 5 mg (5 mg  Oral Given 09/10/16 1834)     Initial Impression / Assessment and Plan / ED Course  I have reviewed the triage vital signs and the nursing notes.  Pertinent labs & imaging results that were available during my care of the patient were reviewed by me and considered in my medical decision making (see chart for details).  Clinical Course  Value Comment By Time  DG Shoulder Right No obvious fracture or dislocation.  Lona Kettle, [place] 09/18 1640    Patient presents to ED with complaint of right shoulder pain following injury. Patient is afebrile and non-toxic appearing in NAD. Blood pressure mildly elevated, pt h/o HTN. TTP of right shoulder without deformity, swelling, warmth, or erythema. Decrease ROM and mild decrease in strength may be secondary to pain. Sensation and pulses intact. X-ray negative for fracture or dislocation; degenerative changes noted. Low suspicion for septic joint. Suspect ?tendonapthy vs. ?frozen shoulder vs. ?strain. Discussed results and plan with pt. Sling provided. Symptomatic management to include RICE and tylenol/motrin for pain relief.  Follow up with orthopedics. Return precautions given. Pt voiced understanding and is agreeable.    Final Clinical Impressions(s) / ED Diagnoses   Final diagnoses:  Shoulder pain, right    I personally performed the services described in this documentation, which was scribed in my presence. The recorded information has been reviewed and is accurate.    New Prescriptions Discharge Medication List as of 09/10/2016  6:50 PM    START taking these medications   Details  cyclobenzaprine (FLEXERIL) 5 MG tablet Take 1 tablet (5 mg total) by mouth 3 (three) times daily as needed for muscle spasms., Starting Mon 09/10/2016, Print         [place], [place] 09/12/16 1837    Lyndal Pulley, MD 09/15/16 [phone]

## 2016-09-10 NOTE — Discharge Instructions (Signed)
Read the information below.   Your x-ray did not show any obvious fracture or dislocation. You do have some arthritic changes in your shoulder.  You were given a sling in the ED. Be sure to use for the next 24-48 hours. Do not perform overhead activities. Activity as tolerated You can take tylenol or motrin for pain relief. Ice for 20 minute increments for the next 2-3 days.   I have prescribed a muscle relaxer. Take as needed; however, this can make you drowsy, do not drive after taking.  Use the prescribed medication as directed.  Please discuss all new medications with your pharmacist.   It is important that you follow up with orthopedics for further evaluation and management. I have provided the contact information above. Please call tomorrow to be scheduled.  You may return to the Emergency Department at any time for worsening condition or any new symptoms that concern you. Return to ED if you develop fever, redness/warmth to joint, or numbness.

## 2016-09-10 NOTE — ED Triage Notes (Signed)
Pt reports to the ED for eval of right shoulder pain. Pt was throwing trash into a garbage can yesterday and when he swung upwards he developed a pain in his right shoulder. Has continued to bother him into today so he came in to be seen. Able to move his arm/shoulder but it does cause increased pain. CMS intact.

## 2016-09-10 NOTE — ED Notes (Signed)
Declined W/C at D/C and was escorted to lobby by RN. 

## 2016-10-02 ENCOUNTER — Telehealth: Payer: Self-pay

## 2016-10-02 ENCOUNTER — Other Ambulatory Visit: Payer: Self-pay | Admitting: Family Medicine

## 2016-10-02 DIAGNOSIS — M25511 Pain in right shoulder: Secondary | ICD-10-CM

## 2016-10-02 NOTE — Telephone Encounter (Signed)
error 

## 2016-10-11 ENCOUNTER — Ambulatory Visit (AMBULATORY_SURGERY_CENTER): Payer: Self-pay | Admitting: *Deleted

## 2016-10-11 VITALS — Ht 71.0 in | Wt 199.0 lb

## 2016-10-11 DIAGNOSIS — Z1211 Encounter for screening for malignant neoplasm of colon: Secondary | ICD-10-CM

## 2016-10-11 MED ORDER — NA SULFATE-K SULFATE-MG SULF 17.5-3.13-1.6 GM/177ML PO SOLN: 1.0000 | Freq: Once | ORAL | 0 refills | Status: AC

## 2016-10-11 MED FILL — SUPREP BOWEL PREP KIT: 17.5-3.13-1 | 1 days supply | Qty: 354 | Fill #0

## 2016-10-11 NOTE — Progress Notes (Signed)
No egg or soy allergy. No anesthesia problems.  No home O2.  No diet meds.  

## 2016-10-16 MED FILL — NAPROXEN 500 MG TABLET: 500 | 30 days supply | Qty: 60 | Fill #0

## 2016-10-24 ENCOUNTER — Ambulatory Visit (AMBULATORY_SURGERY_CENTER): Payer: Medicaid Other | Admitting: Gastroenterology

## 2016-10-24 ENCOUNTER — Encounter: Payer: Self-pay | Admitting: Gastroenterology

## 2016-10-24 VITALS — BP 134/87 | HR 69 | Temp 99.3°F | Resp 13 | Ht 71.0 in | Wt 199.0 lb

## 2016-10-24 DIAGNOSIS — Z1211 Encounter for screening for malignant neoplasm of colon: Secondary | ICD-10-CM | POA: Diagnosis not present

## 2016-10-24 DIAGNOSIS — Z1212 Encounter for screening for malignant neoplasm of rectum: Secondary | ICD-10-CM

## 2016-10-24 HISTORY — PX: COLONOSCOPY: SHX174

## 2016-10-24 MED ORDER — SODIUM CHLORIDE 0.9 % IV SOLN: 500.0000 mL | INTRAVENOUS | Status: DC

## 2016-10-24 NOTE — Progress Notes (Signed)
Patient drank 8 ounce water at 1150. Informed Josh Monday, CRNA

## 2016-10-24 NOTE — Patient Instructions (Signed)
Discharge instructions given. Normal exam. Resume previous medications. YOU HAD AN ENDOSCOPIC PROCEDURE TODAY AT THE  ENDOSCOPY CENTER:   Refer to the procedure report that was given to you for any specific questions about what was found during the examination.  If the procedure report does not answer your questions, please call your gastroenterologist to clarify.  If you requested that your care partner not be given the details of your procedure findings, then the procedure report has been included in a sealed envelope for you to review at your convenience later.  YOU SHOULD EXPECT: Some feelings of bloating in the abdomen. Passage of more gas than usual.  Walking can help get rid of the air that was put into your GI tract during the procedure and reduce the bloating. If you had a lower endoscopy (such as a colonoscopy or flexible sigmoidoscopy) you may notice spotting of blood in your stool or on the toilet paper. If you underwent a bowel prep for your procedure, you may not have a normal bowel movement for a few days.  Please Note:  You might notice some irritation and congestion in your nose or some drainage.  This is from the oxygen used during your procedure.  There is no need for concern and it should clear up in a day or so.  SYMPTOMS TO REPORT IMMEDIATELY:   Following lower endoscopy (colonoscopy or flexible sigmoidoscopy):  Excessive amounts of blood in the stool  Significant tenderness or worsening of abdominal pains  Swelling of the abdomen that is new, acute  Fever of 100F or higher   For urgent or emergent issues, a gastroenterologist can be reached at any hour by calling [phone].   DIET:  We do recommend a small meal at first, but then you may proceed to your regular diet.  Drink plenty of fluids but you should avoid alcoholic beverages for 24 hours.  ACTIVITY:  You should plan to take it easy for the rest of today and you should NOT DRIVE or use heavy machinery  until tomorrow (because of the sedation medicines used during the test).    FOLLOW UP: Our staff will call the number listed on your records the next business day following your procedure to check on you and address any questions or concerns that you may have regarding the information given to you following your procedure. If we do not reach you, we will leave a message.  However, if you are feeling well and you are not experiencing any problems, there is no need to return our call.  We will assume that you have returned to your regular daily activities without incident.  If any biopsies were taken you will be contacted by phone or by letter within the next 1-3 weeks.  Please call us at [phone] if you have not heard about the biopsies in 3 weeks.    SIGNATURES/CONFIDENTIALITY: You and/or your care partner have signed paperwork which will be entered into your electronic medical record.  These signatures attest to the fact that that the information above on your After Visit Summary has been reviewed and is understood.  Full responsibility of the confidentiality of this discharge information lies with you and/or your care-partner. 

## 2016-10-24 NOTE — Progress Notes (Signed)
Report to PACU, RN, vss, BBS= Clear.  

## 2016-10-24 NOTE — Op Note (Signed)
[facility] Patient Name: Troy Elliott Procedure Date: 10/24/2016 1:33 PM MRN: [id number] Endoscopist: Sherilyn Cooter L. Myrtie Neither , MD Age: 50 Referring MD:  Date of Birth: 1966/08/18 Gender: Male Account #: [id number] Procedure:                Colonoscopy Indications:              Screening for colorectal malignant neoplasm, This                            is the patient's first colonoscopy Medicines:                Monitored Anesthesia Care Procedure:                Pre-Anesthesia Assessment:                           - Prior to the procedure, a History and Physical                            was performed, and patient medications and                            allergies were reviewed. The patient's tolerance of                            previous anesthesia was also reviewed. The risks                            and benefits of the procedure and the sedation                            options and risks were discussed with the patient.                            All questions were answered, and informed consent                            was obtained. Prior Anticoagulants: The patient has                            taken no previous anticoagulant or antiplatelet                            agents. ASA Grade Assessment: II - A patient with                            mild systemic disease. After reviewing the risks                            and benefits, the patient was deemed in                            satisfactory condition to undergo the procedure.  After obtaining informed consent, the colonoscope                            was passed under direct vision. Throughout the                            procedure, the patient's blood pressure, pulse, and                            oxygen saturations were monitored continuously. The                            Model CF-HQ190L [id number] [id number]) scope was introduced                            through the anus and  advanced to the the cecum,                            identified by appendiceal orifice and ileocecal                            valve. The colonoscopy was performed without                            difficulty. The patient tolerated the procedure                            well. The quality of the bowel preparation was                            excellent. The ileocecal valve, appendiceal                            orifice, and rectum were photographed. The quality                            of the bowel preparation was evaluated using the                            BBPS [facility]([place] Bowel Preparation Scale) with scores                            of: Right Colon = 3, Transverse Colon = 3 and Left                            Colon = 3. The total BBPS score equals 9. The bowel                            preparation used was SUPREP. Scope In: 1:35:07 PM Scope Out: 1:44:23 PM Scope Withdrawal Time: 0 hours 6 minutes 36 seconds  Total Procedure Duration: 0 hours 9 minutes 16 seconds  Findings:                 The perianal  and digital rectal examinations were                            normal.                           The entire examined colon appeared normal on direct                            and retroflexion views. Complications:            No immediate complications. Estimated Blood Loss:     Estimated blood loss: none. Impression:               - The entire examined colon is normal on direct and                            retroflexion views.                           - No specimens collected. Recommendation:           - Patient has a contact number available for                            emergencies. The signs and symptoms of potential                            delayed complications were discussed with the                            patient. Return to normal activities tomorrow.                            Written discharge instructions were provided to the                             patient.                           - Resume previous diet.                           - Continue present medications.                           - Repeat colonoscopy in 10 years for screening                            purposes. Laina Guerrieri L. Myrtie Neitheranis, MD 10/24/2016 1:48:46 PM This report has been signed electronically.

## 2016-10-25 ENCOUNTER — Telehealth: Payer: Self-pay

## 2016-10-25 MED FILL — MONTELUKAST SOD 10 MG TAB: 10 | 30 days supply | Qty: 30 | Fill #1

## 2016-10-25 MED FILL — LOSARTAN POTASSIUM 50 MG TA: 50 | 30 days supply | Qty: 60 | Fill #1

## 2016-10-25 MED FILL — ATORVASTATIN 10 MG TABLET: 10 | 30 days supply | Qty: 30 | Fill #1

## 2016-10-25 MED FILL — HYDROCHLOROTHIAZIDE 25 MG T: 25 | 30 days supply | Qty: 30 | Fill #1

## 2016-10-25 NOTE — Telephone Encounter (Signed)
  Follow up Call-  Call back number 10/24/2016  Post procedure Call Back phone  # [phone]  Permission to leave phone message Yes  Some recent data might be hidden     Patient questions:  Do you have a fever, pain , or abdominal swelling? No. Pain Score  0 *  Have you tolerated food without any problems? Yes.    Have you been able to return to your normal activities? Yes.    Do you have any questions about your discharge instructions: Diet   No. Medications  No. Follow up visit  No.  Do you have questions or concerns about your Care? No.  Actions: * If pain score is 4 or above: No action needed, pain <4.

## 2016-11-12 MED FILL — AMOXICILLIN 500 MG CAPSULE: 500 | 7 days supply | Qty: 28 | Fill #0

## 2017-01-02 ENCOUNTER — Ambulatory Visit: Payer: Medicaid Other | Admitting: Family Medicine

## 2017-01-08 ENCOUNTER — Emergency Department (HOSPITAL_COMMUNITY)
Admission: EM | Admit: 2017-01-08 | Discharge: 2017-01-08 | Disposition: A | Discharge status: Home / Self Care | Payer: Medicaid Other | Attending: Emergency Medicine | Admitting: Emergency Medicine

## 2017-01-08 ENCOUNTER — Encounter (HOSPITAL_COMMUNITY): Payer: Self-pay | Admitting: Emergency Medicine

## 2017-01-08 DIAGNOSIS — J45909 Unspecified asthma, uncomplicated: Secondary | ICD-10-CM | POA: Diagnosis not present

## 2017-01-08 DIAGNOSIS — R05 Cough: Secondary | ICD-10-CM | POA: Diagnosis present

## 2017-01-08 DIAGNOSIS — J069 Acute upper respiratory infection, unspecified: Secondary | ICD-10-CM | POA: Insufficient documentation

## 2017-01-08 DIAGNOSIS — I10 Essential (primary) hypertension: Secondary | ICD-10-CM | POA: Insufficient documentation

## 2017-01-08 DIAGNOSIS — F17210 Nicotine dependence, cigarettes, uncomplicated: Secondary | ICD-10-CM | POA: Diagnosis not present

## 2017-01-08 DIAGNOSIS — Z79899 Other long term (current) drug therapy: Secondary | ICD-10-CM | POA: Insufficient documentation

## 2017-01-08 MED ORDER — ACETAMINOPHEN 325 MG PO TABS
650.0000 mg | ORAL_TABLET | Freq: Once | ORAL | Status: AC
Start: 2017-01-08 — End: 2017-01-08
  Administered 2017-01-08: 650 mg via ORAL
  Filled 2017-01-08: qty 2

## 2017-01-08 MED FILL — LOSARTAN POTASSIUM 50 MG TA: 50 | 30 days supply | Qty: 60 | Fill #2

## 2017-01-08 NOTE — ED Provider Notes (Signed)
[facility]-EMERGENCY DEPT Provider Note   CSN: [id number] Arrival date & time: 01/08/17  0919     History   Chief Complaint Chief Complaint  Patient presents with  . Generalized Body Aches  . Cough    HPI Troy Elliott is a 51 y.o. male presents to the ED today for complaints of generalized body aches, cough, and congestion 2 days. Patient reports his symptoms as worsening since then. Patient states that his cough is productive yellow sputum but denies any blood. Patient states that his symptoms are worse at night when he lies down. Patient has tried different over-the-counter medications with mild relief. Patient reports associated headache when he coughs, chills, and shortness of breath. Patient reports not taking his blood pressure medication for about 1 week due to travel. He states his blood pressure medication is at the pharmacy for pickup. Patient denies chest pain, abdominal pain, documented fevers, sore throat, focal neurological deficits, changes in vision, changes in gait.  The history is provided by the patient. No language interpreter was used.    Past Medical History:  Diagnosis Date  . Arthritis    hand R  . Asthma   . Bipolar 1 disorder (HCC)   . Depression   . GERD (gastroesophageal reflux disease)   . Heart murmur    born with  . Hyperlipidemia   . Hypertension   . Seasonal allergies   . Sickle cell trait [facility])     Patient Active Problem List   Diagnosis Date Noted  . Chronic back pain 02/29/2016  . Essential hypertension 12/07/2015  . Wrist pain 12/07/2015  . Tendonitis of wrist, right 12/07/2015  . History of carpal tunnel syndrome 12/07/2015  . Seasonal allergies 12/07/2015  . Hyperlipidemia 11/16/2015  . Uncontrolled hypertension 11/03/2015  . Tobacco dependence 11/03/2015    Past Surgical History:  Procedure Laterality Date  . APPENDECTOMY    . brain anerysm    . HAND SURGERY Right   . KNEE SURGERY Left   . SHOULDER SURGERY          Home Medications    Prior to Admission medications   Medication Sig Start Date End Date Taking? Authorizing Provider  amitriptyline (ELAVIL) 75 MG tablet Take 75 mg by mouth at bedtime.   Yes Historical Provider, MD  amLODipine (NORVASC) 10 MG tablet Take 1 tablet (10 mg total) by mouth daily. 09/03/16  Yes Henrietta Hoover, NP  atorvastatin (LIPITOR) 10 MG tablet Take 1 tablet (10 mg total) by mouth daily. 09/03/16  Yes Henrietta Hoover, NP  Cariprazine HCl (VRAYLAR) 4.5 MG CAPS Take by mouth.   Yes Historical Provider, MD  cyclobenzaprine (FLEXERIL) 5 MG tablet Take 1 tablet (5 mg total) by mouth 3 (three) times daily as needed for muscle spasms. 09/10/16  Yes Ashley Laurel Meyer, PA-C  FLOVENT HFA 110 MCG/ACT inhaler INHALE 1 PUFF INTO THE LUNGS 2 TIMES DAILY. 05/10/16  Yes Massie Maroon, FNP  fluticasone (FLONASE) 50 MCG/ACT nasal spray Place 1 spray into both nostrils daily as needed for allergies or rhinitis. Reported on 12/07/2015 12/07/15  Yes Massie Maroon, FNP  gabapentin (NEURONTIN) 100 MG capsule Take 1 capsule (100 mg total) by mouth 3 (three) times daily. 12/07/15  Yes Massie Maroon, FNP  hydrochlorothiazide (HYDRODIURIL) 25 MG tablet Take 1 tablet (25 mg total) by mouth daily. 09/03/16  Yes Henrietta Hoover, NP  loratadine (CLARITIN) 10 MG tablet Take 10 mg by mouth daily as needed for allergies.  Reported on 12/07/2015   Yes Historical Provider, MD  losartan (COZAAR) 50 MG tablet Take 1 tablet (50 mg total) by mouth 2 (two) times daily. 09/03/16  Yes Henrietta Hoover, NP  montelukast (SINGULAIR) 10 MG tablet Take 1 tablet (10 mg total) by mouth at bedtime. 09/03/16  Yes Henrietta Hoover, NP  oxyCODONE (OXY IR/ROXICODONE) 5 MG immediate release tablet Take 5 mg by mouth every 6 (six) hours as needed for severe pain. Reported on 05/31/2016   Yes Historical Provider, MD  prazosin (MINIPRESS) 1 MG capsule Take 2 mg by mouth at bedtime. Reported on 02/29/2016   Yes  Historical Provider, MD  VENTOLIN HFA 108 (90 Base) MCG/ACT inhaler INHALE 2 PUFFS INTO THE LUNGS EVERY 6 HOURS AS NEEDED FOR WHEEZING OR SHORTNESS OF BREATH. 05/10/16  Yes Massie Maroon, FNP  DULoxetine (CYMBALTA) 60 MG capsule Take 60 mg by mouth daily. Reported on 05/31/2016    Historical Provider, MD    Family History Family History  Problem Relation Age of Onset  . Diabetes Mother   . Diabetes Sister   . Brain cancer Sister   . Diabetes Maternal Aunt   . Colon cancer Neg Hx     Social History Social History  Substance Use Topics  . Smoking status: Current Every Day Smoker    Packs/day: 0.50    Types: Cigarettes  . Smokeless tobacco: Never Used  . Alcohol use Yes     Comment: 1 or 2 beers daily and drinks more on weekends     Allergies   Dilantin [phenytoin sodium extended]   Review of Systems Review of Systems  Constitutional: Positive for chills. Negative for fever.  HENT: Positive for congestion and sore throat. Negative for hearing loss and trouble swallowing.   Eyes: Negative for visual disturbance.  Respiratory: Positive for cough and shortness of breath.   Cardiovascular: Negative for chest pain.  Gastrointestinal: Positive for nausea. Negative for abdominal pain, diarrhea and vomiting.  Genitourinary: Negative for difficulty urinating and dysuria.  Musculoskeletal: Positive for arthralgias and myalgias. Negative for gait problem, neck pain and neck stiffness.  Skin: Negative for rash and wound.  Neurological: Positive for weakness. Negative for speech difficulty, light-headedness and numbness.     Physical Exam Updated Vital Signs BP 173/97 (BP Location: Right Arm)   Pulse 107   Temp 98.6 F (37 C) (Oral)   Resp 17   SpO2 99%   Physical Exam  Constitutional: He is oriented to person, place, and time. He appears well-developed and well-nourished.  HENT:  Head: Normocephalic and atraumatic.  Nose: Nose normal.  Mouth/Throat: Oropharynx is clear  and moist.  Oropharynx clear with no obvious signs of erythema, edema, or exudates.  Eyes: EOM are normal. Pupils are equal, round, and reactive to light.  Neck: Normal range of motion. Neck supple. No tracheal deviation present.  Cardiovascular: Normal rate, regular rhythm and intact distal pulses.   Pulmonary/Chest: Effort normal and breath sounds normal. No respiratory distress. He has no wheezes.  Abdominal: Soft. He exhibits no mass. There is no tenderness. There is no rebound and no guarding.  Musculoskeletal: Normal range of motion. He exhibits no tenderness.  Neurological: He is alert and oriented to person, place, and time.  Cranial Nerves:  III,IV, VI: ptosis not present, extra-ocular movements intact bilaterally, direct and consensual pupillary light reflexes intact bilaterally V: facial sensation, jaw opening, and bite strength equal bilaterally VII: eyebrow raise, eyelid close, smile, frown, pucker equal bilaterally VIII:  hearing grossly normal bilaterally  IX,X: palate elevation and swallowing intact XI: bilateral shoulder shrug and lateral head rotation equal and strong XII: midline tongue extension  Cerebellar tests negative.  Sensory intact.  Muscle strength 5/5 Patient able to ambulate without difficulty.   Skin: Skin is warm.  Psychiatric: He has a normal mood and affect. His behavior is normal.  Nursing note and vitals reviewed.    ED Treatments / Results  Labs (all labs ordered are listed, but only abnormal results are displayed) Labs Reviewed - No data to display  EKG  EKG Interpretation None       Radiology No results found.  Procedures Procedures (including critical care time)  Medications Ordered in ED Medications  acetaminophen (TYLENOL) tablet 650 mg (650 mg Oral Given 01/08/17 1128)     Initial Impression / Assessment and Plan / ED Course  I have reviewed the triage vital signs and the nursing notes.  Pertinent labs & imaging results  that were available during my care of the patient were reviewed by me and considered in my medical decision making (see chart for details).  Clinical Course   Patient with symptoms consistent with influenza.  Vitals are stable, afebrile.  No signs of dehydration, tolerating PO's.  Lungs are clear. Normal neurological exam. Cerebellar tests normal. Able to ambulate without difficulty. Due to patient's presentation and physical exam a chest x-ray was not ordered bc likely diagnosis of flu.  Discussed the cost versus benefit of Tamiflu treatment with the patient.  The patient understands that symptoms are at the borderline end than the recommended 24-48 hour window of treatment.  Patient will be discharged with instructions to orally hydrate, rest, and use over-the-counter medications such as anti-inflammatories ibuprofen and Aleve for muscle aches and Tylenol for fever.  Patient encouraged to see primary care physician in 2-5 days if symptoms worsen and to continue monitoring and treating blood pressure. Return precautions given for any new or worsening symptoms.  Final Clinical Impressions(s) / ED Diagnoses   Final diagnoses:  Upper respiratory tract infection, unspecified type    New Prescriptions Discharge Medication List as of 01/08/2017 11:20 AM       [address]Mayla Biddy [place] 01/08/17 1529    Derwood KaplanAnkit Nanavati, MD 01/09/17 0720

## 2017-01-08 NOTE — Discharge Instructions (Signed)
Please schedule appointment with Dr. Benedict NeedyNath to begin monitoring and following history of aneurysm. Please pick up your blood pressure medications today at the pharmacy to get back on track of your blood pressure. Please schedule pointing with her primary care physician in 2-5 days if symptoms worsen and to keep monitoring your blood pressure.  Get help right away if: You have severe or persistent: Headache. Ear pain. Sinus pain. Chest pain. You have chronic lung disease and any of the following: Wheezing. Prolonged cough. Coughing up blood. A change in your usual mucus. You have a stiff neck. You have changes in your: Vision. Hearing. Thinking. Mood.

## 2017-01-08 NOTE — ED Triage Notes (Addendum)
Pt from home with c/o generalized body aches, cough, headache only when coughing, chills, and occasionally blood noted from right nare.  Pt has not taken BP medication in over a week due to being out of town.  Denies sore throat or runny nose.  NAD, ambulatory, A&O.

## 2017-02-14 ENCOUNTER — Ambulatory Visit (INDEPENDENT_AMBULATORY_CARE_PROVIDER_SITE_OTHER): Payer: Medicaid Other | Admitting: Family Medicine

## 2017-02-14 ENCOUNTER — Encounter: Payer: Self-pay | Admitting: Family Medicine

## 2017-02-14 VITALS — BP 150/92 | HR 76 | Temp 98.0°F | Resp 16 | Ht 71.0 in | Wt 199.0 lb

## 2017-02-14 DIAGNOSIS — K219 Gastro-esophageal reflux disease without esophagitis: Secondary | ICD-10-CM | POA: Insufficient documentation

## 2017-02-14 DIAGNOSIS — M25511 Pain in right shoulder: Secondary | ICD-10-CM

## 2017-02-14 DIAGNOSIS — E7849 Other hyperlipidemia: Secondary | ICD-10-CM

## 2017-02-14 DIAGNOSIS — R7303 Prediabetes: Secondary | ICD-10-CM | POA: Diagnosis not present

## 2017-02-14 DIAGNOSIS — E784 Other hyperlipidemia: Secondary | ICD-10-CM

## 2017-02-14 DIAGNOSIS — I10 Essential (primary) hypertension: Secondary | ICD-10-CM | POA: Diagnosis not present

## 2017-02-14 DIAGNOSIS — M62838 Other muscle spasm: Secondary | ICD-10-CM

## 2017-02-14 LAB — POCT URINALYSIS DIP (DEVICE)
Bilirubin Urine: NEGATIVE
Glucose, UA: NEGATIVE mg/dL
HGB URINE DIPSTICK: NEGATIVE
Ketones, ur: NEGATIVE mg/dL
Leukocytes, UA: NEGATIVE
NITRITE: NEGATIVE
PH: 5.5 (ref 5.0–8.0)
PROTEIN: NEGATIVE mg/dL
SPECIFIC GRAVITY, URINE: 1.02 (ref 1.005–1.030)
UROBILINOGEN UA: 0.2 mg/dL (ref 0.0–1.0)

## 2017-02-14 LAB — COMPLETE METABOLIC PANEL WITH GFR
ALT: 30 U/L (ref 9–46)
AST: 19 U/L (ref 10–35)
Albumin: 4.3 g/dL (ref 3.6–5.1)
Alkaline Phosphatase: 67 U/L (ref 40–115)
BILIRUBIN TOTAL: 0.4 mg/dL (ref 0.2–1.2)
BUN: 13 mg/dL (ref 7–25)
CHLORIDE: 104 mmol/L (ref 98–110)
CO2: 26 mmol/L (ref 20–31)
CREATININE: 0.97 mg/dL (ref 0.70–1.33)
Calcium: 9.9 mg/dL (ref 8.6–10.3)
GFR, Est Non African American: 89 mL/min (ref >=60)
GLUCOSE: 107 mg/dL — AB (ref 65–99)
Potassium: 4.4 mmol/L (ref 3.5–5.3)
Sodium: 140 mmol/L (ref 135–146)
Total Protein: 7.5 g/dL (ref 6.1–8.1)

## 2017-02-14 LAB — POCT GLYCOSYLATED HEMOGLOBIN (HGB A1C): HEMOGLOBIN A1C: 6.3

## 2017-02-14 MED ORDER — ATORVASTATIN CALCIUM 10 MG PO TABS: 10.0000 mg | ORAL_TABLET | Freq: Every day | ORAL | 2 refills | Status: DC

## 2017-02-14 MED ORDER — RANITIDINE HCL 150 MG PO TABS: 150.0000 mg | ORAL_TABLET | Freq: Every day | ORAL | 2 refills | Status: DC

## 2017-02-14 MED ORDER — LOSARTAN POTASSIUM 50 MG PO TABS: 50.0000 mg | ORAL_TABLET | Freq: Two times a day (BID) | ORAL | 2 refills | Status: DC

## 2017-02-14 MED ORDER — AMLODIPINE BESYLATE 10 MG PO TABS: 10.0000 mg | ORAL_TABLET | Freq: Every day | ORAL | 2 refills | Status: DC

## 2017-02-14 MED ORDER — KETOROLAC TROMETHAMINE 60 MG/2ML IM SOLN
60.0000 mg | Freq: Once | INTRAMUSCULAR | Status: AC
  Administered 2017-02-14: 60 mg via INTRAMUSCULAR

## 2017-02-14 MED ORDER — CYCLOBENZAPRINE HCL 5 MG PO TABS: 5.0000 mg | ORAL_TABLET | Freq: Three times a day (TID) | ORAL | 0 refills | Status: DC | PRN

## 2017-02-14 MED FILL — AMLODIPINE BESYLATE 10 MG T: 10 | 30 days supply | Qty: 30 | Fill #0

## 2017-02-14 MED FILL — ATORVASTATIN 10 MG TABLET: 10 | 30 days supply | Qty: 30 | Fill #0

## 2017-02-14 MED FILL — raNITIdine HCL 150 MG TABS: 150 | 30 days supply | Qty: 30 | Fill #0

## 2017-02-14 MED FILL — LOSARTAN POTASSIUM 50 MG TA: 50 | 30 days supply | Qty: 60 | Fill #0

## 2017-02-14 NOTE — Patient Instructions (Addendum)
Dyslipidemia Dyslipidemia is an imbalance of waxy, fat-like substances (lipids) in the blood. The body needs lipids in small amounts. Dyslipidemia often involves a high level of cholesterol or triglycerides, which are types of lipids. Common forms of dyslipidemia include:  High levels of bad cholesterol (LDL cholesterol). LDL is the type of cholesterol that causes fatty deposits (plaques) to build up in the blood vessels that carry blood away from your heart (arteries).  Low levels of good cholesterol (HDL cholesterol). HDL cholesterol is the type of cholesterol that protects against heart disease. High levels of HDL remove the LDL buildup from arteries.  High levels of triglycerides. Triglycerides are a fatty substance in the blood that is linked to a buildup of plaques in the arteries. You can develop dyslipidemia because of the genes you are born with (primary dyslipidemia) or changes that occur during your life (secondary dyslipidemia), or as a side effect of certain medical treatments. What are the causes? Primary dyslipidemia is caused by changes (mutations) in genes that are passed down through families (inherited). These mutations cause several types of dyslipidemia. Mutations can result in disorders that make the body produce too much LDL cholesterol or triglycerides, or not enough HDL cholesterol. These disorders may lead to heart disease, arterial disease, or stroke at an early age. Causes of secondary dyslipidemia include certain lifestyle choices and diseases that lead to dyslipidemia, such as:  Eating a diet that is high in animal fat.  Not getting enough activity or exercise (having a sedentary lifestyle).  Having diabetes, kidney disease, liver disease, or thyroid disease.  Drinking large amounts of alcohol.  Using certain types of drugs. What increases the risk? You may be at greater risk for dyslipidemia if you are an older man or if you are a woman who has gone through  menopause. Other risk factors include:  Having a family history of dyslipidemia.  Taking certain medicines, including birth control pills, steroids, some diuretics, beta-blockers, and some medicines forHIV.  Smoking cigarettes.  Eating a high-fat diet.  Drinking large amounts of alcohol.  Having certain medical conditions such as diabetes, polycystic ovary syndrome (PCOS), pregnancy, kidney disease, liver disease, or hypothyroidism.  Not exercising regularly.  Being overweight or obese with too much belly fat. What are the signs or symptoms? Dyslipidemia does not usually cause any symptoms. Very high lipid levels can cause fatty bumps under the skin (xanthomas) or a white or gray ring around the black center (pupil) of the eye. Very high triglyceride levels can cause inflammation of the pancreas (pancreatitis). How is this diagnosed? Your health care provider may diagnose dyslipidemia based on a routine blood test (fasting blood test). Because most people do not have symptoms of the condition, this blood testing (lipid profile) is done on adults age 20 and older and is repeated every 5 years. This test checks:  Total cholesterol. This is a measure of the total amount of cholesterol in your blood, including LDL cholesterol, HDL cholesterol, and triglycerides. A healthy number is below 200.  LDL cholesterol. The target number for LDL cholesterol is different for each person, depending on individual risk factors. For most people, a number below 100 is healthy. Ask your health care provider what your LDL cholesterol number should be.  HDL cholesterol. An HDL level of 60 or higher is best because it helps to protect against heart disease. A number below 40 for men or below 50 for women increases the risk for heart disease.  Triglycerides. A healthy triglyceride number   is below 150. If your lipid profile is abnormal, your health care provider may do other blood tests to get more information  about your condition. How is this treated? Treatment depends on the type of dyslipidemia that you have and your other risk factors for heart disease and stroke. Your health care provider will have a target range for your lipid levels based on this information. For many people, treatment starts with lifestyle changes, such as diet and exercise. Your health care provider may recommend that you:  Get regular exercise.  Make changes to your diet.  Quit smoking if you smoke. If diet changes and exercise do not help you reach your goals, your health care provider may also prescribe medicine to lower lipids. The most commonly prescribed type of medicine lowers your LDL cholesterol (statin drug). If you have a high triglyceride level, your provider may prescribe another type of drug (fibrate) or an omega-3 fish oil supplement, or both. Follow these instructions at home:  Take over-the-counter and prescription medicines only as told by your health care provider. This includes supplements.  Get regular exercise. Start an aerobic exercise and strength training program as told by your health care provider. Ask your health care provider what activities are safe for you. Your health care provider may recommend:  30 minutes of aerobic activity 4-6 days a week. Brisk walking is an example of aerobic activity.  Strength training 2 days a week.  Eat a healthy diet as told by your health care provider. This can help you reach and maintain a healthy weight, lower your LDL cholesterol, and raise your HDL cholesterol. It may help to work with a diet and nutrition specialist (dietitian) to make a plan that is right for you. Your dietitian or health care provider may recommend:  Limiting your calories, if you are overweight.  Eating more fruits, vegetables, whole grains, fish, and lean meats.  Limiting saturated fat, trans fat, and cholesterol.  Follow instructions from your health care provider or dietitian  about eating or drinking restrictions.  Limit alcohol intake to no more than one drink per day for nonpregnant women and two drinks per day for men. One drink equals 12 oz of beer, 5 oz of wine, or 1 oz of hard liquor.  Do not use any products that contain nicotine or tobacco, such as cigarettes and e-cigarettes. If you need help quitting, ask your health care provider.  Keep all follow-up visits as told by your health care provider. This is important. Contact a health care provider if:  You are having trouble sticking to your exercise or diet plan.  You are struggling to quit smoking or control your use of alcohol. Summary  Dyslipidemia is an imbalance of waxy, fat-like substances (lipids) in the blood. The body needs lipids in small amounts. Dyslipidemia often involves a high level of cholesterol or triglycerides, which are types of lipids.  Treatment depends on the type of dyslipidemia that you have and your other risk factors for heart disease and stroke.  For many people, treatment starts with lifestyle changes, such as diet and exercise. Your health care provider may also prescribe medicine to lower lipids. This information is not intended to replace advice given to you by your health care provider. Make sure you discuss any questions you have with your health care provider. Document Released: 12/15/2013 Document Revised: 08/06/2016 Document Reviewed: 08/06/2016 Elsevier Interactive Patient Education  2017 [employer]  Food Choices for Gastroesophageal Reflux Disease, Adult When you have   gastroesophageal reflux disease (GERD), the foods you eat and your eating habits are very important. Choosing the right foods can help ease your discomfort. What guidelines do I need to follow?  Choose fruits, vegetables, whole grains, and low-fat dairy products.  Choose low-fat meat, fish, and poultry.  Limit fats such as oils, salad dressings, butter, nuts, and avocado.  Keep a food  diary. This helps you identify foods that cause symptoms.  Avoid foods that cause symptoms. These may be different for everyone.  Eat small meals often instead of 3 large meals a day.  Eat your meals slowly, in a place where you are relaxed.  Limit fried foods.  Cook foods using methods other than frying.  Avoid drinking alcohol.  Avoid drinking large amounts of liquids with your meals.  Avoid bending over or lying down until 2-3 hours after eating. What foods are not recommended? These are some foods and drinks that may make your symptoms worse: Vegetables  Tomatoes. Tomato juice. Tomato and spaghetti sauce. Chili peppers. Onion and garlic. Horseradish. Fruits  Oranges, grapefruit, and lemon (fruit and juice). Meats  High-fat meats, fish, and poultry. This includes hot dogs, ribs, ham, sausage, salami, and bacon. Dairy  Whole milk and chocolate milk. Sour cream. Cream. Butter. Ice cream. Cream cheese. Drinks  Coffee and tea. Bubbly (carbonated) drinks or energy drinks. Condiments  Hot sauce. Barbecue sauce. Sweets/Desserts  Chocolate and cocoa. Donuts. Peppermint and spearmint. Fats and Oils  High-fat foods. This includes French fries and potato chips. Other  Vinegar. Strong spices. This includes black pepper, white pepper, red pepper, cayenne, curry powder, cloves, ginger, and chili powder. The items listed above may not be a complete list of foods and drinks to avoid. Contact your dietitian for more information.  This information is not intended to replace advice given to you by your health care provider. Make sure you discuss any questions you have with your health care provider. Document Released: 06/10/2012 Document Revised: 05/17/2016 Document Reviewed: 10/14/2013 Elsevier Interactive Patient Education  2017 [employer] Inc.  
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
## 2017-02-15 MED FILL — CYCLOBENZAPRINE 5 MG TABLET: 5 | 7 days supply | Qty: 20 | Fill #0

## 2017-03-08 ENCOUNTER — Other Ambulatory Visit: Payer: Self-pay | Admitting: Orthopedic Surgery

## 2017-03-08 DIAGNOSIS — M7541 Impingement syndrome of right shoulder: Secondary | ICD-10-CM

## 2017-03-25 ENCOUNTER — Ambulatory Visit
Admission: RE | Admit: 2017-03-25 | Discharge: 2017-03-25 | Disposition: A | Discharge status: Home / Self Care | Payer: Medicaid Other | Source: Ambulatory Visit | Attending: Orthopedic Surgery | Admitting: Orthopedic Surgery

## 2017-03-25 DIAGNOSIS — M7541 Impingement syndrome of right shoulder: Secondary | ICD-10-CM

## 2017-03-25 MED ORDER — IOPAMIDOL (ISOVUE-M 200) INJECTION 41%
20.0000 mL | Freq: Once | INTRAMUSCULAR | Status: AC
  Administered 2017-03-25: 20 mL via INTRA_ARTICULAR

## 2017-04-03 ENCOUNTER — Encounter (HOSPITAL_BASED_OUTPATIENT_CLINIC_OR_DEPARTMENT_OTHER): Payer: Self-pay | Admitting: *Deleted

## 2017-04-03 ENCOUNTER — Other Ambulatory Visit: Payer: Self-pay | Admitting: Orthopedic Surgery

## 2017-04-03 NOTE — Progress Notes (Signed)
NPO AFTER MN W/ EXCEPTION CLEAR LIQUIDS UNTIL 0630 (NO CREAM/ MILK PRODUCTS).  ARRIVE  AT 1030.  NEEDS ISTAT 8 AND EKG.   WILL TAKE  AM MEDS W/ EXCEPTION NO HCTZ DOS W/ SIPS OF WATER.

## 2017-04-04 MED FILL — LOSARTAN POTASSIUM 50 MG TA: 50 | 30 days supply | Qty: 60 | Fill #1

## 2017-04-04 MED FILL — raNITIdine HCL 150 MG TABS: 150 | 30 days supply | Qty: 30 | Fill #1

## 2017-04-04 MED FILL — AMLODIPINE BESYLATE 10 MG T: 10 | 30 days supply | Qty: 30 | Fill #1

## 2017-04-04 MED FILL — ATORVASTATIN 10 MG TABLET: 10 | 30 days supply | Qty: 30 | Fill #1

## 2017-04-08 ENCOUNTER — Ambulatory Visit (HOSPITAL_BASED_OUTPATIENT_CLINIC_OR_DEPARTMENT_OTHER): Payer: Medicaid Other | Admitting: Anesthesiology

## 2017-04-08 ENCOUNTER — Ambulatory Visit (HOSPITAL_BASED_OUTPATIENT_CLINIC_OR_DEPARTMENT_OTHER)
Admission: RE | Admit: 2017-04-08 | Discharge: 2017-04-08 | Disposition: A | Discharge status: Home / Self Care | Payer: Medicaid Other | Source: Ambulatory Visit | Attending: Orthopedic Surgery | Admitting: Orthopedic Surgery

## 2017-04-08 ENCOUNTER — Other Ambulatory Visit: Payer: Self-pay

## 2017-04-08 ENCOUNTER — Encounter (HOSPITAL_BASED_OUTPATIENT_CLINIC_OR_DEPARTMENT_OTHER): Payer: Self-pay

## 2017-04-08 ENCOUNTER — Encounter (HOSPITAL_BASED_OUTPATIENT_CLINIC_OR_DEPARTMENT_OTHER)
Admission: RE | Disposition: A | Discharge status: Home / Self Care | Payer: Self-pay | Source: Ambulatory Visit | Attending: Orthopedic Surgery

## 2017-04-08 DIAGNOSIS — M25811 Other specified joint disorders, right shoulder: Secondary | ICD-10-CM | POA: Insufficient documentation

## 2017-04-08 DIAGNOSIS — M19011 Primary osteoarthritis, right shoulder: Secondary | ICD-10-CM | POA: Diagnosis not present

## 2017-04-08 DIAGNOSIS — J45909 Unspecified asthma, uncomplicated: Secondary | ICD-10-CM | POA: Diagnosis not present

## 2017-04-08 DIAGNOSIS — I10 Essential (primary) hypertension: Secondary | ICD-10-CM | POA: Diagnosis not present

## 2017-04-08 DIAGNOSIS — F329 Major depressive disorder, single episode, unspecified: Secondary | ICD-10-CM | POA: Insufficient documentation

## 2017-04-08 DIAGNOSIS — X58XXXA Exposure to other specified factors, initial encounter: Secondary | ICD-10-CM | POA: Insufficient documentation

## 2017-04-08 DIAGNOSIS — S43491A Other sprain of right shoulder joint, initial encounter: Secondary | ICD-10-CM | POA: Diagnosis not present

## 2017-04-08 DIAGNOSIS — K219 Gastro-esophageal reflux disease without esophagitis: Secondary | ICD-10-CM | POA: Insufficient documentation

## 2017-04-08 DIAGNOSIS — M75101 Unspecified rotator cuff tear or rupture of right shoulder, not specified as traumatic: Secondary | ICD-10-CM | POA: Diagnosis present

## 2017-04-08 DIAGNOSIS — S46119A Strain of muscle, fascia and tendon of long head of biceps, unspecified arm, initial encounter: Secondary | ICD-10-CM | POA: Diagnosis not present

## 2017-04-08 DIAGNOSIS — F17210 Nicotine dependence, cigarettes, uncomplicated: Secondary | ICD-10-CM | POA: Insufficient documentation

## 2017-04-08 DIAGNOSIS — M75121 Complete rotator cuff tear or rupture of right shoulder, not specified as traumatic: Secondary | ICD-10-CM | POA: Diagnosis not present

## 2017-04-08 DIAGNOSIS — S46111A Strain of muscle, fascia and tendon of long head of biceps, right arm, initial encounter: Secondary | ICD-10-CM | POA: Diagnosis present

## 2017-04-08 DIAGNOSIS — M24111 Other articular cartilage disorders, right shoulder: Secondary | ICD-10-CM | POA: Diagnosis present

## 2017-04-08 DIAGNOSIS — M25511 Pain in right shoulder: Secondary | ICD-10-CM | POA: Diagnosis present

## 2017-04-08 DIAGNOSIS — E785 Hyperlipidemia, unspecified: Secondary | ICD-10-CM | POA: Diagnosis not present

## 2017-04-08 DIAGNOSIS — M65811 Other synovitis and tenosynovitis, right shoulder: Secondary | ICD-10-CM | POA: Insufficient documentation

## 2017-04-08 DIAGNOSIS — M7541 Impingement syndrome of right shoulder: Secondary | ICD-10-CM | POA: Diagnosis present

## 2017-04-08 HISTORY — DX: Carpal tunnel syndrome, bilateral upper limbs: G56.03

## 2017-04-08 HISTORY — PX: SHOULDER ARTHROSCOPY WITH SUBACROMIAL DECOMPRESSION, ROTATOR CUFF REPAIR AND BICEP TENDON REPAIR: SHX5687

## 2017-04-08 HISTORY — DX: Unspecified osteoarthritis, unspecified site: M19.90

## 2017-04-08 HISTORY — DX: Presence of spectacles and contact lenses: Z97.3

## 2017-04-08 HISTORY — DX: Personal history of other specified conditions: Z87.898

## 2017-04-08 HISTORY — DX: Prediabetes: R73.03

## 2017-04-08 HISTORY — DX: Unspecified rotator cuff tear or rupture of right shoulder, not specified as traumatic: M75.101

## 2017-04-08 HISTORY — DX: Other specified postprocedural states: Z98.890

## 2017-04-08 LAB — POCT I-STAT, CHEM 8
BUN: 14 mg/dL (ref 6–20)
CALCIUM ION: 1.23 mmol/L (ref 1.15–1.40)
CREATININE: 0.9 mg/dL (ref 0.61–1.24)
Chloride: 106 mmol/L (ref 101–111)
Glucose, Bld: 122 mg/dL — ABNORMAL HIGH (ref 65–99)
HCT: 45 % (ref 39.0–52.0)
Hemoglobin: 15.3 g/dL (ref 13.0–17.0)
Potassium: 4.1 mmol/L (ref 3.5–5.1)
SODIUM: 140 mmol/L (ref 135–145)
TCO2: 24 mmol/L (ref 0–100)

## 2017-04-08 SURGERY — SHOULDER ARTHROSCOPY WITH SUBACROMIAL DECOMPRESSION, ROTATOR CUFF REPAIR AND BICEP TENDON REPAIR
Anesthesia: General | Laterality: Right

## 2017-04-08 MED ORDER — BUPIVACAINE-EPINEPHRINE (PF) 0.5% -1:200000 IJ SOLN
INTRAMUSCULAR | Status: DC | PRN
  Administered 2017-04-08: 20 mL via PERINEURAL

## 2017-04-08 MED ORDER — PROMETHAZINE HCL 25 MG/ML IJ SOLN
6.2500 mg | INTRAMUSCULAR | Status: DC | PRN
  Filled 2017-04-08: qty 1

## 2017-04-08 MED ORDER — PROPOFOL 10 MG/ML IV BOLUS
INTRAVENOUS | Status: DC | PRN
  Administered 2017-04-08: 200 mg via INTRAVENOUS

## 2017-04-08 MED ORDER — LABETALOL HCL 5 MG/ML IV SOLN
INTRAVENOUS | Status: DC | PRN
  Administered 2017-04-08 (×2): 5 mg via INTRAVENOUS
  Administered 2017-04-08: 10 mg via INTRAVENOUS

## 2017-04-08 MED ORDER — MIDAZOLAM HCL 2 MG/2ML IJ SOLN
2.0000 mg | Freq: Once | INTRAMUSCULAR | Status: AC
  Administered 2017-04-08: 2 mg via INTRAVENOUS
  Filled 2017-04-08: qty 2

## 2017-04-08 MED ORDER — CHLORHEXIDINE GLUCONATE 4 % EX LIQD
60.0000 mL | Freq: Once | CUTANEOUS | Status: DC
Start: 2017-04-08 — End: 2017-04-08
  Filled 2017-04-08: qty 118

## 2017-04-08 MED ORDER — OXYCODONE HCL 5 MG PO TABS: 5.0000 mg | ORAL_TABLET | Freq: Four times a day (QID) | ORAL | 0 refills | Status: AC | PRN

## 2017-04-08 MED ORDER — DEXAMETHASONE SODIUM PHOSPHATE 4 MG/ML IJ SOLN
INTRAMUSCULAR | Status: DC | PRN
  Administered 2017-04-08: 10 mg via INTRAVENOUS

## 2017-04-08 MED ORDER — SUGAMMADEX SODIUM 200 MG/2ML IV SOLN
INTRAVENOUS | Status: AC
  Filled 2017-04-08: qty 2

## 2017-04-08 MED ORDER — MIDAZOLAM HCL 2 MG/2ML IJ SOLN
INTRAMUSCULAR | Status: AC
  Filled 2017-04-08: qty 2

## 2017-04-08 MED ORDER — DEXAMETHASONE SODIUM PHOSPHATE 10 MG/ML IJ SOLN
INTRAMUSCULAR | Status: AC
  Filled 2017-04-08: qty 1

## 2017-04-08 MED ORDER — ONDANSETRON 4 MG PO TBDP: 4.0000 mg | ORAL_TABLET | Freq: Three times a day (TID) | ORAL | 0 refills | Status: DC | PRN

## 2017-04-08 MED ORDER — ONDANSETRON HCL 4 MG/2ML IJ SOLN
INTRAMUSCULAR | Status: AC
  Filled 2017-04-08: qty 2

## 2017-04-08 MED ORDER — LIDOCAINE 2% (20 MG/ML) 5 ML SYRINGE
INTRAMUSCULAR | Status: AC
  Filled 2017-04-08: qty 5

## 2017-04-08 MED ORDER — FENTANYL CITRATE (PF) 100 MCG/2ML IJ SOLN
25.0000 ug | INTRAMUSCULAR | Status: DC | PRN
  Filled 2017-04-08: qty 1

## 2017-04-08 MED ORDER — ROCURONIUM BROMIDE 50 MG/5ML IV SOSY
PREFILLED_SYRINGE | INTRAVENOUS | Status: DC | PRN
  Administered 2017-04-08: 50 mg via INTRAVENOUS

## 2017-04-08 MED ORDER — PROPOFOL 10 MG/ML IV BOLUS
INTRAVENOUS | Status: AC
  Filled 2017-04-08: qty 40

## 2017-04-08 MED ORDER — LACTATED RINGERS IV SOLN
INTRAVENOUS | Status: DC
  Administered 2017-04-08 (×2): via INTRAVENOUS
  Filled 2017-04-08: qty 1000

## 2017-04-08 MED ORDER — LIDOCAINE 2% (20 MG/ML) 5 ML SYRINGE
INTRAMUSCULAR | Status: DC | PRN
  Administered 2017-04-08: 100 mg via INTRAVENOUS

## 2017-04-08 MED ORDER — MIDAZOLAM HCL 2 MG/2ML IJ SOLN
INTRAMUSCULAR | Status: AC
Start: 2017-04-08 — End: 2017-04-08
  Filled 2017-04-08: qty 2

## 2017-04-08 MED ORDER — FENTANYL CITRATE (PF) 100 MCG/2ML IJ SOLN
INTRAMUSCULAR | Status: AC
  Filled 2017-04-08: qty 2

## 2017-04-08 MED ORDER — FENTANYL CITRATE (PF) 100 MCG/2ML IJ SOLN
50.0000 ug | Freq: Once | INTRAMUSCULAR | Status: AC
  Administered 2017-04-08: 50 ug via INTRAVENOUS
  Filled 2017-04-08: qty 1

## 2017-04-08 MED ORDER — CEFAZOLIN SODIUM-DEXTROSE 2-4 GM/100ML-% IV SOLN
INTRAVENOUS | Status: AC
  Filled 2017-04-08: qty 100

## 2017-04-08 MED ORDER — CEFAZOLIN SODIUM-DEXTROSE 2-4 GM/100ML-% IV SOLN
2.0000 g | INTRAVENOUS | Status: AC
  Administered 2017-04-08: 2 g via INTRAVENOUS
  Filled 2017-04-08: qty 100

## 2017-04-08 MED ORDER — FENTANYL CITRATE (PF) 250 MCG/5ML IJ SOLN
INTRAMUSCULAR | Status: AC
  Filled 2017-04-08: qty 5

## 2017-04-08 MED ORDER — FENTANYL CITRATE (PF) 100 MCG/2ML IJ SOLN
INTRAMUSCULAR | Status: DC | PRN
  Administered 2017-04-08: 50 ug via INTRAVENOUS
  Administered 2017-04-08: 100 ug via INTRAVENOUS
  Administered 2017-04-08 (×2): 50 ug via INTRAVENOUS

## 2017-04-08 MED ORDER — ONDANSETRON HCL 4 MG/2ML IJ SOLN
INTRAMUSCULAR | Status: DC | PRN
  Administered 2017-04-08: 4 mg via INTRAVENOUS

## 2017-04-08 MED FILL — oxyCODONE HCL 5 MG TABS: 5 | 7 days supply | Qty: 50 | Fill #0

## 2017-04-08 MED FILL — ONDANSETRON ODT 4 MG TABLET: 4 | 7 days supply | Qty: 20 | Fill #0

## 2017-04-08 SURGICAL SUPPLY — 91 items
ANCHOR SUT BIO SW 4.75 W/FIB (Anchor) ×3 IMPLANT
ANCHOR SUT BIO SW 4.75X19.1 (Anchor) ×3 IMPLANT
ARTHREX ANCHOR SU BIO SW 4.75 W/FIB ×2 IMPLANT
ARTHREX ANCHOR SUTR BIO SW 4.75 X 19.1 ×3 IMPLANT
BLADE CUDA GRT WHITE 3.5 (BLADE) ×3 IMPLANT
BLADE CUTTER GATOR 3.5 (BLADE) IMPLANT
BLADE GREAT WHITE 4.2 (BLADE) ×2 IMPLANT
BLADE GREAT WHITE 4.2MM (BLADE) ×1
BLADE SURG 11 STRL SS (BLADE) ×3 IMPLANT
BLADE SURG 15 STRL LF DISP TIS (BLADE) IMPLANT
BLADE SURG 15 STRL SS (BLADE)
BNDG COHESIVE 4X5 TAN NS LF (GAUZE/BANDAGES/DRESSINGS) ×3 IMPLANT
BUR 3.5 LG SPHERICAL (BURR) IMPLANT
BUR OVAL 4.0 (BURR) IMPLANT
BUR OVAL 6.0 (BURR) ×3 IMPLANT
BUR VERTEX HOODED 4.5 (BURR) ×3 IMPLANT
BURR 3.5 LG SPHERICAL (BURR)
BURR 3.5MM LG SPHERICAL (BURR)
CANNULA 5.75X7 CRYSTAL CLEAR (CANNULA) IMPLANT
CANNULA 5.75X71 LONG (CANNULA) IMPLANT
CANNULA TWIST IN 8.25X7CM (CANNULA) ×3 IMPLANT
CLOSURE WOUND 1/2 X4 (GAUZE/BANDAGES/DRESSINGS) ×1
COVER BACK TABLE 60X90IN (DRAPES) ×3 IMPLANT
COVER MAYO STAND STRL (DRAPES) ×3 IMPLANT
DRAPE LG THREE QUARTER DISP (DRAPES) ×3 IMPLANT
DRAPE ORTHO SPLIT 77X108 STRL (DRAPES) ×4
DRAPE POUCH INSTRU U-SHP 10X18 (DRAPES) ×3 IMPLANT
DRAPE STERI 35X30 U-POUCH (DRAPES) ×3 IMPLANT
DRAPE SURG 17X23 STRL (DRAPES) ×3 IMPLANT
DRAPE SURG ORHT 6 SPLT 77X108 (DRAPES) ×2 IMPLANT
DRAPE U-SHAPE 47X51 STRL (DRAPES) ×3 IMPLANT
DRSG PAD ABDOMINAL 8X10 ST (GAUZE/BANDAGES/DRESSINGS) ×3 IMPLANT
DURAPREP 26ML APPLICATOR (WOUND CARE) ×3 IMPLANT
ELECT MENISCUS 165MM 90D (ELECTRODE) IMPLANT
ELECT REM PT RETURN 9FT ADLT (ELECTROSURGICAL) ×3
ELECTRODE REM PT RTRN 9FT ADLT (ELECTROSURGICAL) ×1 IMPLANT
FIBERSTICK 2 (SUTURE) IMPLANT
GAUZE SPONGE 4X4 12PLY STRL LF (GAUZE/BANDAGES/DRESSINGS) ×3 IMPLANT
GAUZE XEROFORM 1X8 LF (GAUZE/BANDAGES/DRESSINGS) ×3 IMPLANT
GLOVE BIO SURGEON STRL SZ7.5 (GLOVE) ×3 IMPLANT
GLOVE BIOGEL PI IND STRL 8 (GLOVE) ×1 IMPLANT
GLOVE BIOGEL PI INDICATOR 8 (GLOVE) ×2
GOWN STRL REUS W/ TWL LRG LVL3 (GOWN DISPOSABLE) ×1 IMPLANT
GOWN STRL REUS W/TWL LRG LVL3 (GOWN DISPOSABLE) ×2
GOWN STRL REUS W/TWL XL LVL3 (GOWN DISPOSABLE) ×6 IMPLANT
KIT MINI BIO ANCHOR DRILL (KITS) IMPLANT
KIT RM TURNOVER CYSTO AR (KITS) ×3 IMPLANT
LASSO SUT 90 DEGREE (SUTURE) IMPLANT
LOOP 2 FIBERLINK CLOSED (SUTURE) IMPLANT
MANIFOLD NEPTUNE II (INSTRUMENTS) ×3 IMPLANT
NEEDLE 1/2 CIR CATGUT .05X1.09 (NEEDLE) IMPLANT
NEEDLE HYPO 22GX1.5 SAFETY (NEEDLE) ×6 IMPLANT
NEEDLE SCORPION MULTI FIRE (NEEDLE) ×3 IMPLANT
NEEDLE SPNL 18GX3.5 QUINCKE PK (NEEDLE) ×3 IMPLANT
NS IRRIG 500ML POUR BTL (IV SOLUTION) IMPLANT
PACK BASIN DAY SURGERY FS (CUSTOM PROCEDURE TRAY) ×3 IMPLANT
PAD ARMBOARD 7.5X6 YLW CONV (MISCELLANEOUS) IMPLANT
PENCIL BUTTON HOLSTER BLD 10FT (ELECTRODE) IMPLANT
PROBE BIPOLAR ATHRO 135MM 90D (MISCELLANEOUS) ×2 IMPLANT
SET ARTHROSCOPY TUBING (MISCELLANEOUS) ×2
SET ARTHROSCOPY TUBING PVC (MISCELLANEOUS) ×1 IMPLANT
SLEEVE ARM SUSPENSION SYSTEM (MISCELLANEOUS) ×3 IMPLANT
SLING ULTRA II AB L (ORTHOPEDIC SUPPLIES) IMPLANT
SLING ULTRA II AB S (ORTHOPEDIC SUPPLIES) IMPLANT
SLING ULTRA II L (ORTHOPEDIC SUPPLIES) ×3 IMPLANT
SPONGE GAUZE 4X4 12PLY (GAUZE/BANDAGES/DRESSINGS) ×3 IMPLANT
SPONGE LAP 4X18 X RAY DECT (DISPOSABLE) IMPLANT
STRIP CLOSURE SKIN 1/2X4 (GAUZE/BANDAGES/DRESSINGS) ×2 IMPLANT
SUCTION FRAZIER HANDLE 10FR (MISCELLANEOUS)
SUCTION TUBE FRAZIER 10FR DISP (MISCELLANEOUS) IMPLANT
SUT 2 FIBERLOOP 20 STRT BLUE (SUTURE)
SUT ETHILON 3 0 PS 1 (SUTURE) IMPLANT
SUT FIBERWIRE #2 38 T-5 BLUE (SUTURE) ×3
SUT LASSO 45 DEGREE LEFT (SUTURE) IMPLANT
SUT LASSO 45D RIGHT (SUTURE) IMPLANT
SUT MNCRL AB 3-0 PS2 27 (SUTURE) ×6 IMPLANT
SUT PDS AB 0 CT1 36 (SUTURE) IMPLANT
SUT TIGER TAPE 7 IN WHITE (SUTURE) IMPLANT
SUT VIC AB 0 CT1 36 (SUTURE) IMPLANT
SUT VIC AB 2-0 CT1 27 (SUTURE)
SUT VIC AB 2-0 CT1 TAPERPNT 27 (SUTURE) IMPLANT
SUTURE 2 FIBERLOOP 20 STRT BLU (SUTURE) IMPLANT
SUTURE FIBERWR #2 38 T-5 BLUE (SUTURE) ×1 IMPLANT
SYR 20CC LL (SYRINGE) IMPLANT
SYR CONTROL 10ML LL (SYRINGE) IMPLANT
TAPE CLOTH SURG 6X10 WHT LF (GAUZE/BANDAGES/DRESSINGS) ×3 IMPLANT
TOWEL OR 17X24 6PK STRL BLUE (TOWEL DISPOSABLE) ×3 IMPLANT
TUBE CONNECTING 12'X1/4 (SUCTIONS) ×2
TUBE CONNECTING 12X1/4 (SUCTIONS) ×4 IMPLANT
WATER STERILE IRR 500ML POUR (IV SOLUTION) ×3 IMPLANT
YANKAUER SUCT BULB TIP NO VENT (SUCTIONS) IMPLANT

## 2017-04-08 NOTE — Anesthesia Preprocedure Evaluation (Addendum)
Anesthesia Evaluation    Airway Mallampati: II  TM Distance: >3 FB Neck ROM: Full    Dental  (+) Teeth Intact, Dental Advisory Given, Caps,    Pulmonary asthma , Current Smoker,    Pulmonary exam normal breath sounds clear to auscultation       Cardiovascular hypertension, Pt. on medications Normal cardiovascular exam Rhythm:Regular Rate:Normal     Neuro/Psych PSYCHIATRIC DISORDERS Depression Bipolar Disorder s/p cerebral aneurysm clipping    GI/Hepatic GERD  Medicated,  Endo/Other    Renal/GU      Musculoskeletal  (+) Arthritis , Osteoarthritis,    Abdominal   Peds  Hematology  (+) Blood dyscrasia, Sickle cell trait ,   Anesthesia Other Findings   Reproductive/Obstetrics                            Anesthesia Physical Anesthesia Plan  ASA: II  Anesthesia Plan: General   Post-op Pain Management:  Regional for Post-op pain   Induction: Intravenous  Airway Management Planned: Oral ETT  Additional Equipment:   Intra-op Plan:   Post-operative Plan: Extubation in OR  Informed Consent: I have reviewed the patients History and Physical, chart, labs and discussed the procedure including the risks, benefits and alternatives for the proposed anesthesia with the patient or authorized representative who has indicated his/her understanding and acceptance.   Dental advisory given  Plan Discussed with: CRNA  Anesthesia Plan Comments: (Risks/benefits of general anesthesia discussed with patient including risk of damage to teeth, lips, gum, and tongue, nausea/vomiting, allergic reactions to medications, and the possibility of heart attack, stroke and death.  All patient questions answered.  Patient wishes to proceed.)        Anesthesia Quick Evaluation

## 2017-04-08 NOTE — Anesthesia Procedure Notes (Signed)
Anesthesia Regional Block: Interscalene brachial plexus block   Pre-Anesthetic Checklist: ,, timeout performed, Correct Patient, Correct Site, Correct Laterality, Correct Procedure, Correct Position, site marked, Risks and benefits discussed,  Surgical consent,  Pre-op evaluation,  At surgeon's request and post-op pain management  Laterality: Right  Prep: chloraprep       Needles:  Injection technique: Single-shot  Needle Type: Echogenic Needle     Needle Length: 5cm  Needle Gauge: 21     Additional Needles:   Procedures: ultrasound guided,,,,,,,,  Narrative:  Start time: 04/08/2017 11:44 AM End time: 04/08/2017 11:49 AM Injection made incrementally with aspirations every 5 mL.  Performed by: Personally  Anesthesiologist: Cecile Hearing  Additional Notes: No pain on injection. No increased resistance to injection. Injection made in 5cc increments.  Good needle visualization.  Patient tolerated procedure well.

## 2017-04-08 NOTE — Anesthesia Procedure Notes (Signed)
Procedure Name: Intubation Date/Time: 04/08/2017 12:33 PM Performed by: Wanita Chamberlain Pre-anesthesia Checklist: Patient identified, Timeout performed, Emergency Drugs available, Suction available and Patient being monitored Patient Re-evaluated:Patient Re-evaluated prior to inductionOxygen Delivery Method: Circle system utilized Preoxygenation: Pre-oxygenation with 100% oxygen Intubation Type: IV induction Ventilation: Mask ventilation without difficulty Laryngoscope Size: Mac and 4 Grade View: Grade II Tube type: Oral Tube size: 8.0 mm Number of attempts: 1 Airway Equipment and Method: Stylet Placement Confirmation: ETT inserted through vocal cords under direct vision,  positive ETCO2,  CO2 detector and breath sounds checked- equal and bilateral Secured at: 22 cm Tube secured with: Tape Dental Injury: Teeth and Oropharynx as per pre-operative assessment

## 2017-04-08 NOTE — Brief Op Note (Signed)
04/08/2017  3:03 PM  PATIENT:  Troy Elliott  51 y.o. male  PRE-OPERATIVE DIAGNOSIS:  Right shoulder rotator cuff tear, AC joint arthrosis  POST-OPERATIVE DIAGNOSIS:  Right shoulder rotator cuff tear, AC joint arthrosis  PROCEDURE:  Procedure(s): Right shoulder arthroscopic rotator cuff repair, subacromial decompression, distal clavicle excision,    (Right)  SURGEON:  Surgeon(s) and Role:    * Yolonda Kida, MD - Primary  PHYSICIAN ASSISTANT:   ASSISTANTS: none   ANESTHESIA:   regional and general  EBL:  Total I/O In: 1500 [I.V.:1500] Out: 60 [Blood:60]  BLOOD ADMINISTERED:none  DRAINS: none   LOCAL MEDICATIONS USED:  NONE  SPECIMEN:  No Specimen  DISPOSITION OF SPECIMEN:  N/A  COUNTS:  YES  TOURNIQUET:  * No tourniquets in log *  DICTATION: .Note written in EPIC  PLAN OF CARE: Discharge to home after PACU  PATIENT DISPOSITION:  PACU - hemodynamically stable.   Delay start of Pharmacological VTE agent (>24hrs) due to surgical blood loss or risk of bleeding: not applicable

## 2017-04-08 NOTE — Discharge Instructions (Signed)

## 2017-04-08 NOTE — Transfer of Care (Signed)
Immediate Anesthesia Transfer of Care Note  Patient: Troy Elliott  Procedure(s) Performed: Procedure(s): Right shoulder arthroscopic rotator cuff repair, subacromial decompression, distal clavicle excision,    (Right)  Patient Location: PACU  Anesthesia Type:General  Level of Consciousness: awake, alert , oriented and patient cooperative  Airway & Oxygen Therapy: Patient Spontanous Breathing and Patient connected to nasal cannula oxygen  Post-op Assessment: Report given to RN and Post -op Vital signs reviewed and stable  Post vital signs: Reviewed and stable  Last Vitals:  Vitals:   04/08/17 1036 04/08/17 1456  BP: (!) 148/78 (!) (P) 179/97  Pulse: 72 (P) 77  Resp: 19 (P) 16  Temp: 36.6 C (P) 36.7 C    Last Pain:  Vitals:   04/08/17 1036  TempSrc: Oral      Patients Stated Pain Goal: 8 (04/08/17 1105)  Complications: No apparent anesthesia complications

## 2017-04-08 NOTE — H&P (Signed)
ORTHOPAEDIC H and P  REQUESTING PHYSICIAN: Yolonda Kida, MD  PCP:  Massie Maroon, FNP  Chief Complaint: Right shoulder pain  HPI: Troy Elliott is a 51 y.o. male who complains of right shoulder pain and weakness recalcitrant to conservative treatment with injections and PT.  He has a CT arthrogram that showed rotator cuff tear, large acromial spur and AC joint DJD.  He is here today for surgery.  Past Medical History:  Diagnosis Date  . Arthritis    hand R  . Asthma   . Bipolar 1 disorder (HCC)   . Carpal tunnel syndrome on both sides   . Depression   . GERD (gastroesophageal reflux disease)   . History of cardiac murmur as a child   . History of surgery for cerebral aneurysm    2006  right side -- clipped-- per pt no residual  . Hyperlipidemia   . Hypertension   . OA (osteoarthritis)    RIGHT SHOULDER AC JOINT  . Pre-diabetes   . Right rotator cuff tear   . Seasonal allergies   . Sickle cell trait (HCC)   . Wears glasses    Past Surgical History:  Procedure Laterality Date  . APPENDECTOMY  2005  . CEREBRAL ANEURYSM REPAIR  2006   clipping-- right side  . COLONOSCOPY  10/24/2016  . IRRIGATION AND DEBRIDEMENT ABSCESS  2013   left shoulder area  . KNEE ARTHROSCOPY Left 2013   Social History   Social History  . Marital status: Single    Spouse name: N/A  . Number of children: N/A  . Years of education: N/A   Social History Main Topics  . Smoking status: Current Every Day Smoker    Packs/day: 0.50    Years: 20.00    Types: Cigarettes  . Smokeless tobacco: Never Used  . Alcohol use Yes     Comment: 40 OZ BEER DAILY  . Drug use: No  . Sexual activity: Not Asked   Other Topics Concern  . None   Social History Narrative  . None   Family History  Problem Relation Age of Onset  . Diabetes Mother   . Diabetes Sister   . Brain cancer Sister   . Diabetes Maternal Aunt   . Colon cancer Neg Hx    Allergies  Allergen Reactions  .  Dilantin [Phenytoin Sodium Extended] Hives and Rash   Prior to Admission medications   Medication Sig Start Date End Date Taking? Authorizing Provider  amitriptyline (ELAVIL) 75 MG tablet Take 75 mg by mouth at bedtime.   Yes Historical Provider, MD  atorvastatin (LIPITOR) 10 MG tablet Take 1 tablet (10 mg total) by mouth daily. Patient taking differently: Take 10 mg by mouth every morning.  02/14/17  Yes Massie Maroon, FNP  cyclobenzaprine (FLEXERIL) 5 MG tablet Take 1 tablet (5 mg total) by mouth 3 (three) times daily as needed for muscle spasms. 02/14/17  Yes Massie Maroon, FNP  FLOVENT HFA 110 MCG/ACT inhaler INHALE 1 PUFF INTO THE LUNGS 2 TIMES DAILY. 05/10/16  Yes Massie Maroon, FNP  fluticasone (FLONASE) 50 MCG/ACT nasal spray Place 1 spray into both nostrils daily as needed for allergies or rhinitis. Reported on 12/07/2015 12/07/15  Yes Massie Maroon, FNP  hydrochlorothiazide (HYDRODIURIL) 25 MG tablet Take 1 tablet (25 mg total) by mouth daily. Patient taking differently: Take 25 mg by mouth every morning.  09/03/16  Yes Henrietta Hoover, NP  loratadine (CLARITIN) 10 MG  tablet Take 10 mg by mouth daily as needed for allergies. Reported on 12/07/2015   Yes Historical Provider, MD  losartan (COZAAR) 50 MG tablet Take 1 tablet (50 mg total) by mouth 2 (two) times daily. 02/14/17  Yes Massie Maroon, FNP  montelukast (SINGULAIR) 10 MG tablet Take 1 tablet (10 mg total) by mouth at bedtime. 09/03/16  Yes Henrietta Hoover, NP  ranitidine (ZANTAC) 150 MG tablet Take 1 tablet (150 mg total) by mouth at bedtime. 02/14/17  Yes Massie Maroon, FNP  VENTOLIN HFA 108 (90 Base) MCG/ACT inhaler INHALE 2 PUFFS INTO THE LUNGS EVERY 6 HOURS AS NEEDED FOR WHEEZING OR SHORTNESS OF BREATH. 05/10/16  Yes Massie Maroon, FNP   No results found.  Positive ROS: All other systems have been reviewed and were otherwise negative with the exception of those mentioned in the HPI and as above.  Physical  Exam: General: Alert, no acute distress Cardiovascular: No pedal edema Respiratory: No cyanosis, no use of accessory musculature GI: No organomegaly, abdomen is soft and non-tender Skin: No lesions in the area of chief complaint Neurologic: Sensation intact distally Psychiatric: Patient is competent for consent with normal mood and affect Lymphatic: No axillary or cervical lymphadenopathy    Assessment: 1. Right shoulder rotator cuff tear 2. Right shoulder impingement 3.  Right shoulder AC joint arthritis  Plan: -arthroscopic interventions for cuff repair, subacromial decompression, and distal clavicle excision. -we reviewed the procedure and discussed the risks and benefits again, which had been previously reviewed in my office last month.  He did provide informed consent -The risks, benefits, and alternatives were discussed with the patient. There are risks associated with the surgery including, but not limited to, problems with anesthesia (death), infection, fracture of bones, loosening or failure of implants, malunion, nonunion, hematoma (blood accumulation) which may require surgical drainage, blood clots, pulmonary embolism, nerve injury, and blood vessel injury. The patient understands these risks and elects to proceed. -will plan to dc home from PACU     Yolonda Kida, MD Cell [phone]    04/08/2017 12:11 PM

## 2017-04-08 NOTE — Anesthesia Postprocedure Evaluation (Addendum)
Anesthesia Post Note  Patient: Troy Elliott  Procedure(s) Performed: Procedure(s) (LRB): Right shoulder arthroscopic rotator cuff repair, subacromial decompression, distal clavicle excision,    (Right)  Patient location during evaluation: PACU Anesthesia Type: General Level of consciousness: awake, awake and alert and oriented Pain management: pain level controlled Vital Signs Assessment: post-procedure vital signs reviewed and stable Respiratory status: spontaneous breathing and respiratory function stable Cardiovascular status: blood pressure returned to baseline Anesthetic complications: no       Last Vitals:  Vitals:   04/08/17 1550 04/08/17 1630  BP:  (!) 152/77  Pulse: 65 74  Resp: 19 16  Temp:  37.2 C    Last Pain:  Vitals:   04/08/17 1036  TempSrc: Oral                 Nicolis Boody COKER

## 2017-04-08 NOTE — Op Note (Addendum)
Date of Surgery: 04/08/2017  INDICATIONS: Mr. Baig is a 51 y.o.-year-old male with a right shoulder rotator cuff tear, subacromial impingement/bursitis, and AC joint pain with arthritis.  He is well know to my clinic and failed conservative treatment with injection and PT with NSAIDs.  He was found to have the above diagnosis on CT arthrogram.  Due to the degree of pain and disability and his desire to have better function we discussed operative intervention with shoulder arthroscopy and rotator cuff repair with subacromial decompression and distal clavicle excision with biceps work as indicated;  The patient did consent to the procedure after discussion of the risks and benefits.  PREOPERATIVE DIAGNOSIS:  1. Right shoulder complete rotator cuff tear 2. Right shoulder subacromial impingement 3. Right shoulder Acromioclavicular arthritis  POSTOPERATIVE DIAGNOSIS:  1. Right shoulder complete rotator cuff tear 2. Right shoulder subacromial impingement 3. Right shoulder Acromioclavicular arthritis 4. Right shoulder long head biceps tendon tear 5.  Right shoulder synovitis  PROCEDURE:  1.  Right shoulder arthroscopic rotator cuff repair 2. Right shoulder arthroscopic extensive debridement 3.  Right shoulder arthroscopic biceps tenotomy 4.  Right shoulder arthroscopic distal clavicle partial clavulectomy ([facility]um[facility]) 5. Right shoulder arthroscopic Subacromial decompression with release of CA ligament   SURGEON: Maryan Rued, M.D.  ASSIST: none.  ANESTHESIA:  general, and regional supraclavicular block  IV FLUIDS AND URINE: See anesthesia.  ESTIMATED BLOOD LOSS: 30 mL.  IMPLANTS:  Arthrex 4.55mm fiber Tape swivel lock  X 2  DRAINS: none  COMPLICATIONS: None.  DESCRIPTION OF PROCEDURE:   After obtaining informed consent the patient was brought to the operating table and underwent satisfactory anesthesia. An exam under anesthesia revealed full range of motion. He was placed in the  left lateral decubitus position with an axillary roll and all bony prominences properly padded. A standard surgical timeout was performed. He was placed in 10 pounds of gentle in-line suspension.  Standard posterior and anterior superior portals were established. A diagnostic evaluation of the glenohumeral joint was performed. The biceps tendon was markedly synovitic and adherent to the interval tissue.  It also had a longitudinal tear that involved more than 50% of the tendon being separated from the superior labrum. It was tenotomized. The anterior interval tissue was very synovitic and adhered to the subscapularis, but after careful dissection the subscap was noted to be intact. The superior and posterior labrum showed extensive degenerative tearing.  An extensive debridement was performed of the superior anterior and posterior labrum, and interval tissue. The articular surfaces revealed no significant chondromalacia . The subscapularis was intact. The rotator cuff was torn and the greater tuberosity footprint was prepared for repair. The posterior cuff had minimal articular sided partial tearing.  Releases were performed on the capsular surface of the rotator cuff.  The arthroscope was inserted in the subacromial space and an additional lateral portal was established. An acromioplasty performed nicely decompressing the subacromial space with a motorized burr and a cutting block technique.  The type III acromion was smoothed into a type I.  The CA ligament was released. Bursitis in the subacromial space was removed as well as releases were performed on the bursal surface of the rotator cuff. there was a 1.5 cm x 2 cm crescent shaped supraspinatus tear.  Next cannulas were placed appropriately. Through an accessory lateral portal a 4.75 mm biocomposite anchors were inserted.  The fibertape weld was cut to allow two tendon passes from the medial row anchor.  Double row repair was obtained  by securing all  sutures a lateral 4.75 swivel lock anchor. The bone was of good quality. This completed the rotator cuff repair.  Finally, we moved to the distal clavicle resection.  Working through the anterior portal we first used the RF wand and shaver to expose the [facility] joint. The burr was used to contour the undersurface of the medial acromion and then to resect the lateral aspect of the distal clavicle.  Care was taken to preserve the superior and posterior capsular structures.  At the conclusion of the resection there was approximately 1.0 cm of bony resection.  The arthroscope was then removed and portals closed with 3-0 Monocryl in standard fashion followed by a sterile occlusive dressing Polar Care ice sleeve and a slingshot sling. The patient was sent to recovery in stable condition and tolerated the procedure well, and there were no complications, and all counts were correct.    POSTOPERATIVE PLAN: STILES MAXCY will be NWB to the RUE and remain in the sling for the next 6-8 weeks and be placed on the < 3 cm rotator cuff repair.  He will take 325 mg asa for 3 weeks for DVT ppx.  Return to clinic to see MD in 2 weeks for wound check.

## 2017-04-10 ENCOUNTER — Encounter (HOSPITAL_BASED_OUTPATIENT_CLINIC_OR_DEPARTMENT_OTHER): Payer: Self-pay | Admitting: Orthopedic Surgery

## 2017-04-18 ENCOUNTER — Ambulatory Visit: Payer: Medicaid Other | Attending: Orthopedic Surgery

## 2017-04-18 DIAGNOSIS — M25611 Stiffness of right shoulder, not elsewhere classified: Secondary | ICD-10-CM | POA: Insufficient documentation

## 2017-04-18 DIAGNOSIS — M25511 Pain in right shoulder: Secondary | ICD-10-CM | POA: Insufficient documentation

## 2017-04-18 DIAGNOSIS — Z9889 Other specified postprocedural states: Secondary | ICD-10-CM | POA: Insufficient documentation

## 2017-04-18 NOTE — Therapy (Signed)
[facility] [address] [place], [place], [address] Phone: [phone]   Fax:  [phone]  Physical Therapy Evaluation  Patient Details  Name: Troy Elliott MRN: [id number] Date of Birth: Oct 31, 1966 Referring Provider: Duwayne Heck Elliott  Encounter Date: 04/18/2017      PT End of Session - 04/18/17 1630    Visit Number 1   Number of Visits 4   Date for PT Re-Evaluation 06/27/17   Authorization Type Medicaid   PT Start Time 0345   PT Stop Time 0425   PT Time Calculation (min) 40 min   Activity Tolerance Patient tolerated treatment well;No increased pain   Behavior During Therapy WFL for tasks assessed/performed      Past Medical History:  Diagnosis Date  . Arthritis    hand R  . Asthma   . Bipolar 1 disorder (HCC)   . Carpal tunnel syndrome on both sides   . Depression   . GERD (gastroesophageal reflux disease)   . History of cardiac murmur as a child   . History of surgery for cerebral aneurysm    2006  right side -- clipped-- per pt no residual  . Hyperlipidemia   . Hypertension   . OA (osteoarthritis)    RIGHT SHOULDER AC JOINT  . Pre-diabetes   . Right rotator cuff tear   . Seasonal allergies   . Sickle cell trait (HCC)   . Wears glasses     Past Surgical History:  Procedure Laterality Date  . APPENDECTOMY  2005  . CEREBRAL ANEURYSM REPAIR  2006   clipping-- right side  . COLONOSCOPY  10/24/2016  . IRRIGATION AND DEBRIDEMENT ABSCESS  2013   left shoulder area  . KNEE ARTHROSCOPY Left 2013  . SHOULDER ARTHROSCOPY WITH SUBACROMIAL DECOMPRESSION, ROTATOR CUFF REPAIR AND BICEP TENDON REPAIR Right 04/08/2017   Procedure: Right shoulder arthroscopic rotator cuff repair, subacromial decompression, distal clavicle excision,   ;  Surgeon: Troy Kida, Elliott;  Location: [facility];  Service: Orthopedics;  Laterality: Right;    There were no vitals filed for this visit.        Subjective Assessment - 04/18/17 1600    Subjective He reports torn RTC . He relates onset of pain 6 months ago.  He was doing current job. He was throwing bag of trash in cand and shoulder locked  up on him.      Pertinent History RT thumb injury and finger and hand crushed.   He is limited prior to shoulder injury.  Used LT hand for eating  and other strenuous gripping and work activity.    Limitations Lifting  sleeps in sling, dressing,  not driving,       How long can you sit comfortably? As needed   How long can you stand comfortably? NA   How long can you walk comfortably? NA   Patient Stated Goals He wants to get shoulder stronger to return to work and normal activity( 100 pounds possible)   Currently in Pain? No/denies  took meds   Pain Location Shoulder   Pain Orientation Right   Pain Descriptors / Indicators Tightness   Pain Type Surgical pain   Pain Onset 1 to 4 weeks ago   Pain Frequency Intermittent   Aggravating Factors  PM lying pain   Pain Relieving Factors meds, ice packs   Multiple Pain Sites No            [facility] PT Assessment -  04/18/17 0001      Assessment   Medical Diagnosis RT RTC repair arthroscopic   Referring Provider Troy Elliott   Onset Date/Surgical Date 04/08/17   Hand Dominance Right   Next Elliott Visit 4 /30/18   Prior Therapy No     Precautions   Precautions Shoulder   Type of Shoulder Precautions Rotator cuff repair   Precaution Comments Elliott said to not lift anything   Required Braces or Orthoses Sling     Restrictions   Weight Bearing Restrictions Yes   RUE Weight Bearing Non weight bearing     Balance Screen   Has the patient fallen in the past 6 months No   Has the patient had a decrease in activity level because of a fear of falling?  No   Is the patient reluctant to leave their home because of a fear of falling?  No     Prior Function   Level of Independence Needs assistance with ADLs   Vocation Full time employment   Vocation  Requirements lifting bodies , push and pulling     Cognition   Overall Cognitive Status Within Functional Limits for tasks assessed     Posture/Postural Control   Posture Comments WNL     ROM / Strength   AROM / PROM / Strength PROM;Strength;AROM     AROM   Overall AROM Comments RT elbow flexion 140 degrees , extension -30    AROM Assessment Site Shoulder   Right/Left Shoulder Right;Left   Left Shoulder Flexion 160 Degrees   Left Shoulder ABduction 175 Degrees   Left Shoulder External Rotation 90 Degrees     PROM   PROM Assessment Site Shoulder   Right/Left Shoulder Right   Right Shoulder Extension 35 Degrees   Right Shoulder Flexion 90 Degrees   Right Shoulder ABduction 45 Degrees   Right Shoulder Internal Rotation 55 Degrees   Right Shoulder External Rotation 10 Degrees  rotation with humerous at side     Strength   Overall Strength Comments Not tested due to 10 days post RTC repair                           PT Education - 04/18/17 1606    Education provided Yes   Education Details POC sling execises   Person(s) Educated Patient   Methods Explanation;Handout;Verbal cues;Tactile cues;Demonstration   Comprehension Verbalized understanding;Returned demonstration             PT Long Term Goals - 04/18/17 1625      PT LONG TERM GOAL #1   Title He willl be independent with all HEP issued    Baseline No program and under RTC protocol   Time 10   Period Weeks   Status New     PT LONG TERM GOAL #2   Title He will demo full active ROM RT shoulder compared to LT   Time 10   Period Weeks   Status New     PT LONG TERM GOAL #3   Title He will be able to do exercise with resistance without pain to prep for self strengthening at home   Baseline no resisitance allowed   Time 10   Period Weeks   Status New     PT LONG TERM GOAL #4   Title He will report only minimal 1-2 intermittant pain    Baseline constant pain without meds   Time 10  Period Weeks   Status New     PT LONG TERM GOAL #5   Title He will be able to sleep comfortably in bed without suport   Baseline uses sling support to sleep   Time 10   Period Weeks   Status New               Plan - 04/18/17 1631    Clinical Impression Statement Troy Elliott presernts for low complexity eval post RTC repair   (CPT code 65784)  and SAD  (29826).  with bicep tenotomy (69629) and Mumford clavilectomy. He demo pain with decreased ROM and use of RT arm . He had dcreased use of RT arm before due to hand injury in past.  He was  observed using his arm for reaching and picking up light objects so I asked him to stop this and only use arm for exercise and to support ofor rest. unless Elliott monday gives different instructions   Rehab Potential Good   Clinical Impairments Affecting Rehab Potential Left hand injury with limited use prior to RTC surgery   PT Frequency 2x / week   PT Duration --  10 weeks   PT Treatment/Interventions Cryotherapy;Moist Heat;Patient/family education;Passive range of motion;Therapeutic exercise;Manual techniques;Taping  RTC protocol   PT Next Visit Plan Week 3 of protocol exercise and HEP   PT Home Exercise Plan Sling exercises    Consulted and Agree with Plan of Care Patient      Patient will benefit from skilled therapeutic intervention in order to improve the following deficits and impairments:  Pain, Decreased strength, Decreased activity tolerance, Impaired UE functional use, Decreased range of motion  Visit Diagnosis: S/P right rotator cuff repair  Stiffness of right shoulder, not elsewhere classified  Acute pain of right shoulder     Problem List Patient Active Problem List   Diagnosis Date Noted  . Nontraumatic rotator cuff tear, right 04/08/2017  . Subacromial impingement, right 04/08/2017  . Osteoarthritis of right AC (acromioclavicular) joint 04/08/2017  . Labral tear of shoulder, degenerative, right 04/08/2017  . Labral  tear of long head of biceps tendon, right, initial encounter 04/08/2017  . Muscle spasm of right shoulder 02/14/2017  . Prediabetes 02/14/2017  . Right shoulder pain 02/14/2017  . GERD without esophagitis 02/14/2017  . Chronic back pain 02/29/2016  . Essential hypertension 12/07/2015  . Wrist pain 12/07/2015  . Tendonitis of wrist, right 12/07/2015  . History of carpal tunnel syndrome 12/07/2015  . Seasonal allergies 12/07/2015  . Hyperlipidemia 11/16/2015  . Uncontrolled hypertension 11/03/2015  . Tobacco dependence 11/03/2015    Caprice Red  PT 04/18/2017, 6:00 PM  [facility] [address] [place], [place], [address] Phone: [phone]   Fax:  [phone]  Name: Troy Elliott MRN: [id number] Date of Birth: August 31, 1966

## 2017-04-18 NOTE — Patient Instructions (Addendum)
Issued from cabinet sling exercises with Gentle elbow flex/extension, scapula rotations and pendulum exercises  3x/day 10 reps all gentle. He was to stop with any pain.

## 2017-04-22 MED FILL — oxyCODONE HCL 5 MG TABS: 5 | 4 days supply | Qty: 60 | Fill #0

## 2017-04-29 ENCOUNTER — Ambulatory Visit: Payer: Medicaid Other | Attending: Orthopedic Surgery

## 2017-04-29 ENCOUNTER — Telehealth: Payer: Self-pay

## 2017-04-29 DIAGNOSIS — Z9889 Other specified postprocedural states: Secondary | ICD-10-CM | POA: Insufficient documentation

## 2017-04-29 DIAGNOSIS — M25511 Pain in right shoulder: Secondary | ICD-10-CM | POA: Insufficient documentation

## 2017-04-29 DIAGNOSIS — M25611 Stiffness of right shoulder, not elsewhere classified: Secondary | ICD-10-CM | POA: Insufficient documentation

## 2017-04-29 NOTE — Telephone Encounter (Signed)
Unable to leave message as his voice mail box not set up

## 2017-05-03 MED FILL — oxyCODONE HCL 5 MG TABS: 5 | 5 days supply | Qty: 40 | Fill #0

## 2017-05-10 MED FILL — HYDROCODON-APAP 7.5-325: 7.5-325 | 5 days supply | Qty: 40 | Fill #0

## 2017-05-14 ENCOUNTER — Ambulatory Visit: Payer: Medicaid Other | Admitting: Family Medicine

## 2017-05-16 ENCOUNTER — Ambulatory Visit (INDEPENDENT_AMBULATORY_CARE_PROVIDER_SITE_OTHER): Payer: Medicaid Other | Admitting: Family Medicine

## 2017-05-16 ENCOUNTER — Encounter: Payer: Self-pay | Admitting: Family Medicine

## 2017-05-16 DIAGNOSIS — I10 Essential (primary) hypertension: Secondary | ICD-10-CM | POA: Diagnosis not present

## 2017-05-16 DIAGNOSIS — E7849 Other hyperlipidemia: Secondary | ICD-10-CM

## 2017-05-16 DIAGNOSIS — E784 Other hyperlipidemia: Secondary | ICD-10-CM | POA: Diagnosis not present

## 2017-05-16 LAB — POCT URINALYSIS DIP (DEVICE)
BILIRUBIN URINE: NEGATIVE
Glucose, UA: NEGATIVE mg/dL
HGB URINE DIPSTICK: NEGATIVE
Ketones, ur: NEGATIVE mg/dL
Leukocytes, UA: NEGATIVE
NITRITE: NEGATIVE
PH: 6 (ref 5.0–8.0)
PROTEIN: NEGATIVE mg/dL
Specific Gravity, Urine: <=1.005 (ref 1.005–1.030)
UROBILINOGEN UA: 0.2 mg/dL (ref 0.0–1.0)

## 2017-05-16 LAB — COMPLETE METABOLIC PANEL WITH GFR
ALBUMIN: 4.3 g/dL (ref 3.6–5.1)
ALK PHOS: 74 U/L (ref 40–115)
ALT: 22 U/L (ref 9–46)
AST: 18 U/L (ref 10–35)
BILIRUBIN TOTAL: 0.4 mg/dL (ref 0.2–1.2)
BUN: 13 mg/dL (ref 7–25)
CO2: 26 mmol/L (ref 20–31)
CREATININE: 0.97 mg/dL (ref 0.70–1.33)
Calcium: 9.9 mg/dL (ref 8.6–10.3)
Chloride: 104 mmol/L (ref 98–110)
GFR, EST NON AFRICAN AMERICAN: 89 mL/min (ref >=60)
GFR, Est African American: >89 mL/min (ref >=60)
GLUCOSE: 104 mg/dL — AB (ref 65–99)
Potassium: 4.7 mmol/L (ref 3.5–5.3)
SODIUM: 138 mmol/L (ref 135–146)
TOTAL PROTEIN: 7.1 g/dL (ref 6.1–8.1)

## 2017-05-16 MED ORDER — ATORVASTATIN CALCIUM 10 MG PO TABS: 10.0000 mg | ORAL_TABLET | Freq: Every day | ORAL | 5 refills | Status: DC

## 2017-05-16 MED ORDER — HYDROCHLOROTHIAZIDE 25 MG PO TABS: 25.0000 mg | ORAL_TABLET | Freq: Every day | ORAL | 5 refills | Status: DC

## 2017-05-16 MED ORDER — LOSARTAN POTASSIUM 50 MG PO TABS: 50.0000 mg | ORAL_TABLET | Freq: Two times a day (BID) | ORAL | 2 refills | Status: DC

## 2017-05-16 MED FILL — ATORVASTATIN 10 MG TABLET: 10 | 30 days supply | Qty: 30 | Fill #0 | Status: TO

## 2017-05-16 MED FILL — HYDROCHLOROTHIAZIDE 25 MG T: 25 | 30 days supply | Qty: 30 | Fill #0 | Status: TO

## 2017-05-16 MED FILL — LOSARTAN POTASSIUM 50 MG TA: 50 | 30 days supply | Qty: 60 | Fill #0 | Status: TO

## 2017-05-16 NOTE — Progress Notes (Signed)
Subjective:    Patient ID: ELMUS MATHES, male    DOB: April 21, 1966, 51 y.o.   MRN: [id number]  Mr. Troy Elliott, a 51 year old male with a history of uncontrolled hypertension presents for follow-up. He is status post right shoulder repair 1 month ago. He is currently in physical therapy and followed by pain management for right shoulder.   He has not been taking antihypertensive medications over the past several days. He says that he has been out of Losartan. He does not check blood pressure at home. He does not exercise or follow a low fat, low sodium diet. He is a chronic everyday smoker and is not interested in quitting at this time. He has a history of high cholesterol and takes Atorvastatin daily. He currently denies dizziness, fatigue, shortness of breath, syncope, or lower extremity edema.       Past Medical History:  Diagnosis Date  . Arthritis    hand R  . Asthma   . Bipolar 1 disorder (HCC)   . Carpal tunnel syndrome on both sides   . Depression   . GERD (gastroesophageal reflux disease)   . History of cardiac murmur as a child   . History of surgery for cerebral aneurysm    2006  right side -- clipped-- per pt no residual  . Hyperlipidemia   . Hypertension   . OA (osteoarthritis)    RIGHT SHOULDER AC JOINT  . Pre-diabetes   . Right rotator cuff tear   . Seasonal allergies   . Sickle cell trait (HCC)   . Wears glasses    Social History   Social History  . Marital status: Single    Spouse name: N/A  . Number of children: N/A  . Years of education: N/A   Occupational History  . Not on file.   Social History Main Topics  . Smoking status: Current Every Day Smoker    Packs/day: 0.50    Years: 20.00    Types: Cigarettes  . Smokeless tobacco: Never Used  . Alcohol use Yes     Comment: 40 OZ BEER DAILY  . Drug use: No  . Sexual activity: Not on file   Other Topics Concern  . Not on file   Social History Narrative  . No narrative on file    Allergies  Allergen Reactions  . Dilantin [Phenytoin Sodium Extended] Hives and Rash    Review of Systems  Constitutional: Negative.  Negative for fatigue, fever and malaise/fatigue.  HENT: Negative.   Eyes: Negative.  Negative for blurred vision, photophobia and visual disturbance.  Cardiovascular: Negative.  Negative for palpitations and orthopnea.  Gastrointestinal: Negative.        Heartburn  Endocrine: Negative.  Negative for polydipsia, polyphagia and polyuria.  Genitourinary: Negative.   Musculoskeletal: Positive for arthralgias (Right shoulder), back pain and stiffness. Negative for neck pain.  Skin: Negative.   Allergic/Immunologic: Negative.   Neurological: Negative.  Negative for focal weakness.  Hematological: Negative.   Psychiatric/Behavioral: Negative.  Negative for sleep disturbance and suicidal ideas.       Objective:   Physical Exam  Constitutional: He is oriented to person, place, and time. He appears well-developed and well-nourished.  HENT:  Head: Normocephalic and atraumatic.  Right Ear: External ear normal.  Left Ear: External ear normal.  Mouth/Throat: Oropharynx is clear and moist.  Eyes: Conjunctivae and EOM are normal. Pupils are equal, round, and reactive to light.  Neck: Normal range of motion. Neck supple.  Cardiovascular: Normal rate, normal heart sounds and intact distal pulses.   Pulmonary/Chest: Effort normal and breath sounds normal.  Abdominal: Soft. Bowel sounds are normal.  Musculoskeletal:       Lumbar back: He exhibits no swelling and no spasm.  Neurological: He is alert and oriented to person, place, and time. He has normal reflexes.  Skin: Skin is warm and dry.  Psychiatric: He has a normal mood and affect. His behavior is normal. Judgment and thought content normal.      BP (!) 160/94 (BP Location: Left Arm, Patient Position: Sitting, Cuff Size: Large) Comment: manually  Pulse 84   Temp 97.8 F (36.6 C) (Oral)   Resp 16    Ht 5\' 11"  (1.803 m)   Wt 202 lb (91.6 kg)   SpO2 100%   BMI 28.17 kg/m  Assessment & Plan:  1. Uncontrolled hypertension Blood pressure is not at goal on current medication regimen. There have been some  compliance issues here. I have discussed with him the great importance of following the treatment plan exactly as directed in order to achieve a good medical outcome.  Reviewed urinalysis, no proteinuria present  - losartan (COZAAR) 50 MG tablet; Take 1 tablet (50 mg total) by mouth 2 (two) times daily.  Dispense: 60 tablet; Refill: 2 - hydrochlorothiazide (HYDRODIURIL) 25 MG tablet; Take 1 tablet (25 mg total) by mouth daily.  Dispense: 30 tablet; Refill: 5 - Ambulatory referral to Ophthalmology - COMPLETE METABOLIC PANEL WITH GFR - POCT urinalysis dip (device)  2. Other hyperlipidemia The patient is asked to make an attempt to improve diet and exercise patterns to aid in medical management of this problem.  - atorvastatin (LIPITOR) 10 MG tablet; Take 1 tablet (10 mg total) by mouth daily.  Dispense: 30 tablet; Refill: 5   RTC: 2 weeks for bp check. 3 months for hypertension   Nolon NationsLaChina Moore Hollis  MSN, FNP-C [facility] [address]  [place], [place] [id number] [phone]

## 2017-05-17 MED FILL — OXYCODONE-ACETAMINOPHEN 5-3: 5-325 | 14 days supply | Qty: 40 | Fill #0

## 2017-05-19 NOTE — Patient Instructions (Addendum)
Fat and Cholesterol Restricted Diet Getting too much fat and cholesterol in your diet may cause health problems. Following this diet helps keep your fat and cholesterol at normal levels. This can keep you from getting sick. What types of fat should I choose?  Choose monosaturated and polyunsaturated fats. These are found in foods such as olive oil, canola oil, flaxseeds, walnuts, almonds, and seeds.  Eat more omega-3 fats. Good choices include salmon, mackerel, sardines, tuna, flaxseed oil, and ground flaxseeds.  Limit saturated fats. These are in animal products such as meats, butter, and cream. They can also be in plant products such as palm oil, palm kernel oil, and coconut oil.  Avoid foods with partially hydrogenated oils in them. These contain trans fats. Examples of foods that have trans fats are stick margarine, some tub margarines, cookies, crackers, and other baked goods. What general guidelines do I need to follow?  Check food labels. Look for the words "trans fat" and "saturated fat."  When preparing a meal:  Fill half of your plate with vegetables and green salads.  Fill one fourth of your plate with whole grains. Look for the word "whole" as the first word in the ingredient list.  Fill one fourth of your plate with lean protein foods.  Eat more foods that have fiber, like apples, carrots, beans, peas, and barley.  Eat more home-cooked foods. Eat less at restaurants and buffets.  Limit or avoid alcohol.  Limit foods high in starch and sugar.  Limit fried foods.  Cook foods without frying them. Baking, boiling, grilling, and broiling are all great options.  Lose weight if you are overweight. Losing even a small amount of weight can help your overall health. It can also help prevent diseases such as diabetes and heart disease. What foods can I eat? Grains  Whole grains, such as whole wheat or whole grain breads, crackers, cereals, and pasta. Unsweetened oatmeal,  bulgur, barley, quinoa, or brown rice. Corn or whole wheat flour tortillas. Vegetables  Fresh or frozen vegetables (raw, steamed, roasted, or grilled). Green salads. Fruits  All fresh, canned (in natural juice), or frozen fruits. Meat and Other Protein Products  Ground beef (85% or leaner), grass-fed beef, or beef trimmed of fat. Skinless chicken or turkey. Ground chicken or turkey. Pork trimmed of fat. All fish and seafood. Eggs. Dried beans, peas, or lentils. Unsalted nuts or seeds. Unsalted canned or dry beans. Dairy  Low-fat dairy products, such as skim or 1% milk, 2% or reduced-fat cheeses, low-fat ricotta or cottage cheese, or plain low-fat yogurt. Fats and Oils  Tub margarines without trans fats. Light or reduced-fat mayonnaise and salad dressings. Avocado. Olive, canola, sesame, or safflower oils. Natural peanut or almond butter (choose ones without added sugar and oil). The items listed above may not be a complete list of recommended foods or beverages. Contact your dietitian for more options.  What foods are not recommended? Grains  White bread. White pasta. White rice. Cornbread. Bagels, pastries, and croissants. Crackers that contain trans fat. Vegetables  White potatoes. Corn. Creamed or fried vegetables. Vegetables in a cheese sauce. Fruits  Dried fruits. Canned fruit in light or heavy syrup. Fruit juice. Meat and Other Protein Products  Fatty cuts of meat. Ribs, chicken wings, bacon, sausage, bologna, salami, chitterlings, fatback, hot dogs, bratwurst, and packaged luncheon meats. Liver and organ meats. Dairy  Whole or 2% milk, cream, half-and-half, and cream cheese. Whole milk cheeses. Whole-fat or sweetened yogurt. Full-fat cheeses. Nondairy creamers and whipped   toppings. Processed cheese, cheese spreads, or cheese curds. Sweets and Desserts  Corn syrup, sugars, honey, and molasses. Candy. Jam and jelly. Syrup. Sweetened cereals. Cookies, pies, cakes, donuts, muffins, and ice  cream. Fats and Oils  Butter, stick margarine, lard, shortening, ghee, or bacon fat. Coconut, palm kernel, or palm oils. Beverages  Alcohol. Sweetened drinks (such as sodas, lemonade, and fruit drinks or punches). The items listed above may not be a complete list of foods and beverages to avoid. Contact your dietitian for more information.  This information is not intended to replace advice given to you by your health care provider. Make sure you discuss any questions you have with your health care provider. Document Released: 06/10/2012 Document Revised: 08/16/2016 Document Reviewed: 03/11/2014 Elsevier Interactive Patient Education  2017 [place] DASH stands for "Dietary Approaches to Stop Hypertension." The DASH eating plan is a healthy eating plan that has been shown to reduce high blood pressure (hypertension). It may also reduce your risk for type 2 diabetes, heart disease, and stroke. The DASH eating plan may also help with weight loss. What are tips for following this plan? General guidelines   Avoid eating more than 2,300 mg (milligrams) of salt (sodium) a day. If you have hypertension, you may need to reduce your sodium intake to 1,500 mg a day.  Limit alcohol intake to no more than 1 drink a day for nonpregnant women and 2 drinks a day for men. One drink equals 12 oz of beer, 5 oz of wine, or 1 oz of hard liquor.  Work with your health care provider to maintain a healthy body weight or to lose weight. Ask what an ideal weight is for you.  Get at least 30 minutes of exercise that causes your heart to beat faster (aerobic exercise) most days of the week. Activities may include walking, swimming, or biking.  Work with your health care provider or diet and nutrition specialist (dietitian) to adjust your eating plan to your individual calorie needs. Reading food labels   Check food labels for the amount of sodium per serving. Choose foods with less than 5  percent of the Daily Value of sodium. Generally, foods with less than 300 mg of sodium per serving fit into this eating plan.  To find whole grains, look for the word "whole" as the first word in the ingredient list. Shopping   Buy products labeled as "low-sodium" or "no salt added."  Buy fresh foods. Avoid canned foods and premade or frozen meals. Cooking   Avoid adding salt when cooking. Use salt-free seasonings or herbs instead of table salt or sea salt. Check with your health care provider or pharmacist before using salt substitutes.  Do not fry foods. Cook foods using healthy methods such as baking, boiling, grilling, and broiling instead.  Cook with heart-healthy oils, such as olive, canola, soybean, or sunflower oil. Meal planning    Eat a balanced diet that includes:  5 or more servings of fruits and vegetables each day. At each meal, try to fill half of your plate with fruits and vegetables.  Up to 6-8 servings of whole grains each day.  Less than 6 oz of lean meat, poultry, or fish each day. A 3-oz serving of meat is about the same size as a deck of cards. One egg equals 1 oz.  2 servings of low-fat dairy each day.  A serving of nuts, seeds, or beans 5 times each week.  Heart-healthy fats. Healthy  fats called Omega-3 fatty acids are found in foods such as flaxseeds and coldwater fish, like sardines, salmon, and mackerel.  Limit how much you eat of the following:  Canned or prepackaged foods.  Food that is high in trans fat, such as fried foods.  Food that is high in saturated fat, such as fatty meat.  Sweets, desserts, sugary drinks, and other foods with added sugar.  Full-fat dairy products.  Do not salt foods before eating.  Try to eat at least 2 vegetarian meals each week.  Eat more home-cooked food and less restaurant, buffet, and fast food.  When eating at a restaurant, ask that your food be prepared with less salt or no salt, if possible. What foods  are recommended? The items listed may not be a complete list. Talk with your dietitian about what dietary choices are best for you. Grains  Whole-grain or whole-wheat bread. Whole-grain or whole-wheat pasta. Brown rice. [place]. Bulgur. Whole-grain and low-sodium cereals. Pita bread. Low-fat, low-sodium crackers. Whole-wheat flour tortillas. Vegetables  Fresh or frozen vegetables (raw, steamed, roasted, or grilled). Low-sodium or reduced-sodium tomato and vegetable juice. Low-sodium or reduced-sodium tomato sauce and tomato paste. Low-sodium or reduced-sodium canned vegetables. Fruits  All fresh, dried, or frozen fruit. Canned fruit in natural juice (without added sugar). Meat and other protein foods  Skinless chicken or [place]. Ground chicken or [place]. Pork with fat trimmed off. Fish and seafood. Egg whites. Dried beans, peas, or lentils. Unsalted nuts, nut butters, and seeds. Unsalted canned beans. Lean cuts of beef with fat trimmed off. Low-sodium, lean deli meat. Dairy  Low-fat (1%) or fat-free (skim) milk. Fat-free, low-fat, or reduced-fat cheeses. Nonfat, low-sodium ricotta or cottage cheese. Low-fat or nonfat yogurt. Low-fat, low-sodium cheese. Fats and oils  Soft margarine without trans fats. Vegetable oil. Low-fat, reduced-fat, or light mayonnaise and salad dressings (reduced-sodium). Canola, safflower, olive, soybean, and sunflower oils. Avocado. Seasoning and other foods  Herbs. Spices. Seasoning mixes without salt. Unsalted popcorn and pretzels. Fat-free sweets. What foods are not recommended? The items listed may not be a complete list. Talk with your dietitian about what dietary choices are best for you. Grains  Baked goods made with fat, such as croissants, muffins, or some breads. Dry pasta or rice meal packs. Vegetables  Creamed or fried vegetables. Vegetables in a cheese sauce. Regular canned vegetables (not low-sodium or reduced-sodium). Regular canned tomato sauce  and paste (not low-sodium or reduced-sodium). Regular tomato and vegetable juice (not low-sodium or reduced-sodium). Angie Fava. Olives. Fruits  Canned fruit in a light or heavy syrup. Fried fruit. Fruit in cream or butter sauce. Meat and other protein foods  Fatty cuts of meat. Ribs. Fried meat. Berniece Salines. Sausage. Bologna and other processed lunch meats. Salami. Fatback. Hotdogs. Bratwurst. Salted nuts and seeds. Canned beans with added salt. Canned or smoked fish. Whole eggs or egg yolks. Chicken or [place] with skin. Dairy  Whole or 2% milk, cream, and half-and-half. Whole or full-fat cream cheese. Whole-fat or sweetened yogurt. Full-fat cheese. Nondairy creamers. Whipped toppings. Processed cheese and cheese spreads. Fats and oils  Butter. Stick margarine. Lard. Shortening. Ghee. Bacon fat. Tropical oils, such as coconut, palm kernel, or palm oil. Seasoning and other foods  Salted popcorn and pretzels. Onion salt, garlic salt, seasoned salt, table salt, and sea salt. Worcestershire sauce. Tartar sauce. Barbecue sauce. Teriyaki sauce. Soy sauce, including reduced-sodium. Steak sauce. Canned and packaged gravies. Fish sauce. Oyster sauce. Cocktail sauce. Horseradish that you find on the shelf. Ketchup. Mustard.  Meat flavorings and tenderizers. Bouillon cubes. Hot sauce and Tabasco sauce. Premade or packaged marinades. Premade or packaged taco seasonings. Relishes. Regular salad dressings. Where to find more information:  National Heart, Lung, and Blood Institute: [employer].iswww.nhlbi.nih.gov  American Heart Association: www.heart.org Summary  The DASH eating plan is a healthy eating plan that has been shown to reduce high blood pressure (hypertension). It may also reduce your risk for type 2 diabetes, heart disease, and stroke.  With the DASH eating plan, you should limit salt (sodium) intake to 2,300 mg a day. If you have hypertension, you may need to reduce your sodium intake to 1,500 mg a day.  When on the  DASH eating plan, aim to eat more fresh fruits and vegetables, whole grains, lean proteins, low-fat dairy, and heart-healthy fats.  Work with your health care provider or diet and nutrition specialist (dietitian) to adjust your eating plan to your individual calorie needs. This information is not intended to replace advice given to you by your health care provider. Make sure you discuss any questions you have with your health care provider. Document Released: 11/29/2011 Document Revised: 12/03/2016 Document Reviewed: 12/03/2016 Elsevier Interactive Patient Education  2017 [employer]

## 2017-05-21 ENCOUNTER — Ambulatory Visit: Payer: Medicaid Other

## 2017-05-21 DIAGNOSIS — M25611 Stiffness of right shoulder, not elsewhere classified: Secondary | ICD-10-CM | POA: Diagnosis present

## 2017-05-21 DIAGNOSIS — M25511 Pain in right shoulder: Secondary | ICD-10-CM | POA: Diagnosis present

## 2017-05-21 DIAGNOSIS — Z9889 Other specified postprocedural states: Secondary | ICD-10-CM

## 2017-05-21 NOTE — Therapy (Signed)
[facility] [address] [place], [place], [address] Phone: [phone]   Fax:  [phone]  Physical Therapy Treatment  Patient Details  Name: Troy Elliott MRN: [id number] Date of Birth: 10/17/1966 Referring Provider: Duwayne HeckJason Rogers MD  Encounter Date: 05/21/2017      PT End of Session - 05/21/17 1324    Visit Number 2   Number of Visits 4   Date for PT Re-Evaluation 06/27/17   Authorization Type Medicaid   Authorization Time Period 04/29/17 to 07/07/17   Authorization - Visit Number 1   Authorization - Number of Visits 3   PT Start Time 0123   PT Stop Time 0208   PT Time Calculation (min) 45 min   Activity Tolerance Patient tolerated treatment well;No increased pain   Behavior During Therapy WFL for tasks assessed/performed      Past Medical History:  Diagnosis Date  . Arthritis    hand R  . Asthma   . Bipolar 1 disorder (HCC)   . Carpal tunnel syndrome on both sides   . Depression   . GERD (gastroesophageal reflux disease)   . History of cardiac murmur as a child   . History of surgery for cerebral aneurysm    2006  right side -- clipped-- per pt no residual  . Hyperlipidemia   . Hypertension   . OA (osteoarthritis)    RIGHT SHOULDER AC JOINT  . Pre-diabetes   . Right rotator cuff tear   . Seasonal allergies   . Sickle cell trait (HCC)   . Wears glasses     Past Surgical History:  Procedure Laterality Date  . APPENDECTOMY  2005  . CEREBRAL ANEURYSM REPAIR  2006   clipping-- right side  . COLONOSCOPY  10/24/2016  . IRRIGATION AND DEBRIDEMENT ABSCESS  2013   left shoulder area  . KNEE ARTHROSCOPY Left 2013  . SHOULDER ARTHROSCOPY WITH SUBACROMIAL DECOMPRESSION, ROTATOR CUFF REPAIR AND BICEP TENDON REPAIR Right 04/08/2017   Procedure: Right shoulder arthroscopic rotator cuff repair, subacromial decompression, distal clavicle excision,   ;  Surgeon: Yolonda KidaJason Patrick Rogers, MD;  [facility];  Service: Orthopedics;  Laterality: Right;    There were no vitals filed for this visit.      Subjective Assessment - 05/21/17 1326    Subjective More pain from grabing and throwing dog of chest. MD said tendon Not torn. LAst visit to MD 05/09/17. wearing sling for another week   Currently in Pain? Yes   Pain Score 8    Pain Location Shoulder   Pain Orientation Right   Pain Descriptors / Indicators Sharp   Pain Type Surgical pain   Pain Onset More than a month ago   Pain Frequency Constant   Aggravating Factors  Lying down   Pain Relieving Factors meds , ice  , warm shower   Multiple Pain Sites No            [facility] PT Assessment - 05/21/17 0001      AROM   Right/Left Shoulder Right   Right Shoulder Extension 40 Degrees   Right Shoulder Flexion 100 Degrees   Right Shoulder ABduction 85 Degrees   Right Shoulder Internal Rotation 30 Degrees   Right Shoulder External Rotation 62 Degrees  20 degrees forward hor adduct   Right Shoulder Horizontal ABduction -10 Degrees   Right Shoulder Horizontal  ADduction 65 Degrees  [facility] Adult PT Treatment/Exercise - 05/21/17 0001      Shoulder Exercises: Supine   Other Supine Exercises cane exercises x 1-12 reps assisted .   followed by isometric IR/ER to tolerance  x 6-8 reps 5 sec hold . followed sitting assited overhead reach.      Manual Therapy   Manual Therapy Manual Traction;Joint mobilization;Soft tissue mobilization;Passive ROM   Joint Mobilization ong axis distraction Gr 2-3 adn rotation   Soft tissue mobilization ant posterio and superior RT shoulder   Passive ROM all planes x 6-10 reps                    PT Education - 05/21/17 1423    Education provided Yes   Education Details HEP for ROM cautioned to not push through pain and to relax with pain and alow incr motion and to have asssits as needed for protections   Person(s) Educated Patient   Methods  Explanation;Demonstration;Tactile cues;Verbal cues;Handout   Comprehension Returned demonstration;Verbalized understanding             PT Long Term Goals - 05/21/17 1427      PT LONG TERM GOAL #1   Title He willl be independent with all HEP issued    Baseline doing HEP correctly    Status On-going     PT LONG TERM GOAL #2   Title He will demo full active ROM RT shoulder compared to LT   Baseline improved but limited     PT LONG TERM GOAL #3   Title He will be able to do exercise with resistance without pain to prep for self strengthening at home   Baseline Gentle iitial resistance started   Status On-going     PT LONG TERM GOAL #4   Title He will report only minimal 1-2 intermittant pain    Baseline constant pain without medsand with meds   Status On-going     PT LONG TERM GOAL #5   Title He will be able to sleep comfortably in bed without suport   Status On-going               Plan - 05/21/17 1324    Clinical Impression Statement Progress with ROM  after he was cued to relax muscles and to accept that he will have some pain with stretching. No incr pain post. He appears to have 70% or more of ROm toward full ROM. Next visit he should be ready for resisted exercises and to push ROM   PT Treatment/Interventions Cryotherapy;Moist Heat;Patient/family education;Passive range of motion;Therapeutic exercise;Manual techniques;Taping   PT Next Visit Plan week 10 protocol   PT Home Exercise Plan Sling exercises , supine cane stretching , rotation isometrics, assited reach overhead   Consulted and Agree with Plan of Care Patient      Patient will benefit from skilled therapeutic intervention in order to improve the following deficits and impairments:  Pain, Decreased strength, Decreased activity tolerance, Impaired UE functional use, Decreased range of motion  Visit Diagnosis: S/P right rotator cuff repair  Stiffness of right shoulder, not elsewhere classified  Acute  pain of right shoulder     Problem List Patient Active Problem List   Diagnosis Date Noted  . Nontraumatic rotator cuff tear, right 04/08/2017  . Subacromial impingement, right 04/08/2017  . Osteoarthritis of right AC (acromioclavicular) joint 04/08/2017  . Labral tear of shoulder, degenerative, right 04/08/2017  . Labral tear of long head of biceps tendon, right, initial encounter  04/08/2017  . Muscle spasm of right shoulder 02/14/2017  . Prediabetes 02/14/2017  . Right shoulder pain 02/14/2017  . GERD without esophagitis 02/14/2017  . Chronic back pain 02/29/2016  . Essential hypertension 12/07/2015  . Wrist pain 12/07/2015  . Tendonitis of wrist, right 12/07/2015  . History of carpal tunnel syndrome 12/07/2015  . Seasonal allergies 12/07/2015  . Hyperlipidemia 11/16/2015  . Uncontrolled hypertension 11/03/2015  . Tobacco dependence 11/03/2015    Caprice Red  PT 05/21/2017, 2:29 PM  [facility] [address] [place], [place], [address] Phone: [phone]   Fax:  [phone]  Name: Troy Elliott MRN: [id number] Date of Birth: January 03, 1966

## 2017-05-21 NOTE — Patient Instructions (Signed)
From cabinet overhead reach with assistance x 2-3 reps 2x/day, supine cane exercises 2x/day 10-15 reps, isometrics IR /ER x 10 5 sec hold

## 2017-05-30 ENCOUNTER — Ambulatory Visit: Payer: Medicaid Other

## 2017-05-30 VITALS — BP 136/82

## 2017-05-30 DIAGNOSIS — I10 Essential (primary) hypertension: Secondary | ICD-10-CM

## 2017-06-05 MED FILL — OXYCODONE-ACETAMINOPHEN 5-3: 5-325 | 7 days supply | Qty: 30 | Fill #0

## 2017-06-11 ENCOUNTER — Ambulatory Visit: Payer: Medicaid Other | Attending: Orthopedic Surgery

## 2017-06-11 DIAGNOSIS — M25511 Pain in right shoulder: Secondary | ICD-10-CM

## 2017-06-11 DIAGNOSIS — M25611 Stiffness of right shoulder, not elsewhere classified: Secondary | ICD-10-CM | POA: Insufficient documentation

## 2017-06-11 DIAGNOSIS — Z9889 Other specified postprocedural states: Secondary | ICD-10-CM

## 2017-06-11 NOTE — Therapy (Addendum)
[place], [place], [address] Phone: [phone]   Fax:  [phone]  Physical Therapy Treatment/ Discharge  Patient Details  Name: Troy Elliott MRN: [id number] Date of Birth: [birth year] Referring Provider: Victorino December MD  Encounter Date: 06/11/2017      PT End of Session - 06/11/17 1411    Visit Number 3   Number of Visits 4   Date for PT Re-Evaluation 06/27/17   Authorization Type Medicaid   Authorization Time Period 04/29/17 to 07/07/17   Authorization - Visit Number 2   Authorization - Number of Visits 3   PT Start Time 0215   PT Stop Time 0245  had to leave early to attend funeral   PT Time Calculation (min) 30 min   Activity Tolerance Patient tolerated treatment well;No increased pain   Behavior During Therapy WFL for tasks assessed/performed      Past Medical History:  Diagnosis Date  . Arthritis    hand R  . Asthma   . Bipolar 1 disorder ([place])   . Carpal tunnel syndrome on both sides   . Depression   . GERD (gastroesophageal reflux disease)   . History of cardiac murmur as a child   . History of surgery for cerebral aneurysm    2006  right side -- clipped-- per pt no residual  . Hyperlipidemia   . Hypertension   . OA (osteoarthritis)    RIGHT SHOULDER AC JOINT  . Pre-diabetes   . Right rotator cuff tear   . Seasonal allergies   . Sickle cell trait ([place])   . Wears glasses     Past Surgical History:  Procedure Laterality Date  . APPENDECTOMY  2005  . CEREBRAL ANEURYSM REPAIR  2006   clipping-- right side  . COLONOSCOPY  10/24/2016  . IRRIGATION AND DEBRIDEMENT ABSCESS  2013   left shoulder area  . KNEE ARTHROSCOPY Left 2013  . SHOULDER ARTHROSCOPY WITH SUBACROMIAL DECOMPRESSION, ROTATOR CUFF REPAIR AND BICEP TENDON REPAIR Right 04/08/2017   Procedure: Right shoulder arthroscopic rotator cuff repair, subacromial decompression, distal clavicle excision,   ;  Surgeon: Nicholes Stairs, MD;  [facility];  Service: Orthopedics;  Laterality: Right;    There were no vitals filed for this visit.      Subjective Assessment - 06/11/17 1412    Subjective No pain just stiffness. Doing better   Currently in Pain? No/denies            [facility] PT Assessment - 06/11/17 0001      AROM   Right/Left Shoulder Right                     [facility] Adult PT Treatment/Exercise - 06/11/17 0001      Shoulder Exercises: Standing   Other Standing Exercises rockwood red band  (issued) to tolerance. x 12 reps  instructed in attachement and precautions. Trial 2 pounds flexion /abduction /ER /   ext prone and standing. and issued for HEP Cautioned pt yo not over due and d to do lighter weight  2-3 pounds znc for next 2-3 weeks keep resisted exercisee below shoulder height.      Shoulder Exercises: ROM/Strengthening   UBE (Upper Arm Bike) L2 3 min forward 3 min back                PT Education - 06/11/17 1445    Education provided Yes   Education Details  Strength exer RT shoulder   Person(s) Educated Patient   Methods Explanation;Demonstration;Tactile cues;Verbal cues;Handout   Comprehension Returned demonstration;Verbalized understanding             PT Long Term Goals - 06/11/17 1412      PT LONG TERM GOAL #1   Title He willl be independent with all HEP issued    Status On-going     PT LONG TERM GOAL #2   Title He will demo full active ROM RT shoulder compared to LT   Status On-going     PT LONG TERM GOAL #3   Title He will be able to do exercise with resistance without pain to prep for self strengthening at home   Status On-going     PT LONG TERM GOAL #4   Title He will report only minimal 1-2 intermittant pain    Status On-going     PT LONG TERM GOAL #5   Title He will be able to sleep comfortably in bed without suport   Status On-going               Plan - 06/11/17 1411    Clinical Impression  Statement Doing well with no significant pain and tolerance of resisted movements today for HEP. He had to leave prior to ROM measurements so will measure next visit and complete HEP then. discharge. He has done well at home   PT Treatment/Interventions Cryotherapy;Moist Heat;Patient/family education;Passive range of motion;Therapeutic exercise;Manual techniques;Taping   PT Next Visit Plan week 12-16, finich HEP measure and discharge,   PT Home Exercise Plan Sling exercises , supine cane stretching , rotation isometrics, assited reach overhead, unattached scapul, rockwood band prone [place] flex/ER/abduct/ext    Consulted and Agree with Plan of Care Patient      Patient will benefit from skilled therapeutic intervention in order to improve the following deficits and impairments:  Pain, Decreased strength, Decreased activity tolerance, Impaired UE functional use, Decreased range of motion  Visit Diagnosis: S/P right rotator cuff repair  Stiffness of right shoulder, not elsewhere classified  Acute pain of right shoulder     Problem List Patient Active Problem List   Diagnosis Date Noted  . Nontraumatic rotator cuff tear, right 04/08/2017  . Subacromial impingement, right 04/08/2017  . Osteoarthritis of right AC (acromioclavicular) joint 04/08/2017  . Labral tear of shoulder, degenerative, right 04/08/2017  . Labral tear of long head of biceps tendon, right, initial encounter 04/08/2017  . Muscle spasm of right shoulder 02/14/2017  . Prediabetes 02/14/2017  . Right shoulder pain 02/14/2017  . GERD without esophagitis 02/14/2017  . Chronic back pain 02/29/2016  . Essential hypertension 12/07/2015  . Wrist pain 12/07/2015  . Tendonitis of wrist, right 12/07/2015  . History of carpal tunnel syndrome 12/07/2015  . Seasonal allergies 12/07/2015  . Hyperlipidemia 11/16/2015  . Uncontrolled hypertension 11/03/2015  . Tobacco dependence 11/03/2015    Darrel Hoover   PT 06/11/2017, 2:50 PM  [facility] [address] [place], [place], [address] Phone: [phone]   Fax:  [phone]  Name: Troy Elliott MRN: [id number] Date of Birth: 07-14-1966  PHYSICAL THERAPY DISCHARGE SUMMARY  Visits from Start of Care: 1  Current functional level related to goals / functional outcomes: Unknown as he did not return. He was approved 3 visits but no showed scheduled    Remaining deficits: Unknown   Education / Equipment: Unknown  Plan:  Patient goals were not met. Patient is being discharged due to not returning since the last visit.  ?????   Noralee Stain PT 08/05/17   3:08 PM

## 2017-06-11 NOTE — Patient Instructions (Signed)
Issued from cabinet Rockwood and unattached scapula band exercises ( except overhead) 10-25 reps 1-2 x/day, no pain,  ice if sore , stop if pain

## 2017-06-25 ENCOUNTER — Ambulatory Visit: Payer: Medicaid Other | Admitting: Family Medicine

## 2017-07-01 ENCOUNTER — Ambulatory Visit: Payer: Medicaid Other | Attending: Orthopedic Surgery

## 2017-07-01 ENCOUNTER — Telehealth: Payer: Self-pay

## 2017-07-01 NOTE — Telephone Encounter (Signed)
Spoke with Mr Troy Elliott and he reports conflicts with time. Suggested he try to get in before the 15th of this month to use the 3rd visit allowed. He reported he had our phone number

## 2017-08-15 NOTE — Addendum Note (Signed)
Addendum  created 08/15/17 1308 by Lachina Salsberry, MD   Sign clinical note    

## 2017-08-16 ENCOUNTER — Ambulatory Visit: Payer: Medicaid Other | Admitting: Family Medicine

## 2017-09-02 ENCOUNTER — Telehealth: Payer: Self-pay

## 2017-09-02 MED ORDER — ALBUTEROL SULFATE HFA 108 (90 BASE) MCG/ACT IN AERS: INHALATION_SPRAY | RESPIRATORY_TRACT | 0 refills | Status: DC

## 2017-09-02 MED ORDER — FLUTICASONE PROPIONATE HFA 110 MCG/ACT IN AERO: INHALATION_SPRAY | RESPIRATORY_TRACT | 2 refills | Status: DC

## 2017-09-02 NOTE — Telephone Encounter (Signed)
Called and spoke with [facility], they needed a new rx for both inhalers since patient has transferred medications to their pharmacy. I have refilled these and sent in. Thanks!

## 2017-10-18 ENCOUNTER — Other Ambulatory Visit: Payer: Self-pay | Admitting: Family Medicine

## 2017-10-18 DIAGNOSIS — I10 Essential (primary) hypertension: Secondary | ICD-10-CM

## 2018-01-16 ENCOUNTER — Encounter: Payer: Self-pay | Admitting: Family Medicine

## 2018-01-16 ENCOUNTER — Ambulatory Visit (INDEPENDENT_AMBULATORY_CARE_PROVIDER_SITE_OTHER): Payer: Medicaid Other | Admitting: Family Medicine

## 2018-01-16 VITALS — BP 170/94 | HR 77 | Temp 98.1°F | Resp 16 | Ht 71.0 in | Wt 203.0 lb

## 2018-01-16 DIAGNOSIS — R7303 Prediabetes: Secondary | ICD-10-CM | POA: Diagnosis not present

## 2018-01-16 DIAGNOSIS — E7849 Other hyperlipidemia: Secondary | ICD-10-CM

## 2018-01-16 DIAGNOSIS — I10 Essential (primary) hypertension: Secondary | ICD-10-CM

## 2018-01-16 DIAGNOSIS — F17200 Nicotine dependence, unspecified, uncomplicated: Secondary | ICD-10-CM | POA: Diagnosis not present

## 2018-01-16 LAB — POCT URINALYSIS DIP (DEVICE)
Bilirubin Urine: NEGATIVE
Glucose, UA: NEGATIVE mg/dL
Hgb urine dipstick: NEGATIVE
Ketones, ur: NEGATIVE mg/dL
LEUKOCYTES UA: NEGATIVE
NITRITE: NEGATIVE
PH: 5.5 (ref 5.0–8.0)
Protein, ur: NEGATIVE mg/dL
SPECIFIC GRAVITY, URINE: 1.02 (ref 1.005–1.030)
UROBILINOGEN UA: 0.2 mg/dL (ref 0.0–1.0)

## 2018-01-16 LAB — POCT GLYCOSYLATED HEMOGLOBIN (HGB A1C): Hemoglobin A1C: 6.2

## 2018-01-16 MED ORDER — ATORVASTATIN CALCIUM 10 MG PO TABS: 10.0000 mg | ORAL_TABLET | Freq: Every day | ORAL | 5 refills | Status: DC

## 2018-01-16 MED ORDER — CLONIDINE HCL 0.1 MG PO TABS
0.1000 mg | ORAL_TABLET | Freq: Once | ORAL | Status: AC
  Administered 2018-01-16: 0.1 mg via ORAL

## 2018-01-16 MED ORDER — TRIAMTERENE-HCTZ 37.5-25 MG PO TABS: 1.0000 | ORAL_TABLET | Freq: Every day | ORAL | 5 refills | Status: DC

## 2018-01-16 NOTE — Progress Notes (Signed)
Subjective:    Patient ID: Troy Elliott, male    DOB: Sep 05, 1966, 52 y.o.   MRN: [id number]  Mr. Adren Dollins, a 52 year old male with a history of uncontrolled hypertension presents for follow-up.  He has not been taking antihypertensive medications over the past several days. He says that he stopped taking Losartan due to hearing about a recent recall on medication.He says that he was afraid to take any medications.  He does not check blood pressure at home. He does not exercise or follow a low fat, low sodium diet. He is a chronic everyday smoker and is not interested in quitting at this time. He also has a history of high cholesterol. He currently denies dizziness, fatigue, shortness of breath, syncope, or lower extremity edema.     Hypertension  Pertinent negatives include no anxiety, blurred vision, malaise/fatigue, neck pain, orthopnea, palpitations or peripheral edema. Risk factors for coronary artery disease include dyslipidemia, male gender and smoking/tobacco exposure.  Hyperlipidemia  The problem is controlled. He has no history of diabetes, liver disease or obesity. Current antihyperlipidemic treatment includes statins. Risk factors for coronary artery disease include male sex, hypertension and dyslipidemia.     Past Medical History:  Diagnosis Date  . Arthritis    hand R  . Asthma   . Bipolar 1 disorder (HCC)   . Carpal tunnel syndrome on both sides   . Depression   . GERD (gastroesophageal reflux disease)   . History of cardiac murmur as a child   . History of surgery for cerebral aneurysm    2006  right side -- clipped-- per pt no residual  . Hyperlipidemia   . Hypertension   . OA (osteoarthritis)    RIGHT SHOULDER AC JOINT  . Pre-diabetes   . Right rotator cuff tear   . Seasonal allergies   . Sickle cell trait (HCC)   . Wears glasses    Social History   Socioeconomic History  . Marital status: Single    Spouse name: Not on file  . Number of  children: Not on file  . Years of education: Not on file  . Highest education level: Not on file  Social Needs  . Financial resource strain: Not on file  . Food insecurity - worry: Not on file  . Food insecurity - inability: Not on file  . Transportation needs - medical: Not on file  . Transportation needs - non-medical: Not on file  Occupational History  . Not on file  Tobacco Use  . Smoking status: Current Every Day Smoker    Packs/day: 0.50    Years: 20.00    Pack years: 10.00    Types: Cigarettes  . Smokeless tobacco: Never Used  Substance and Sexual Activity  . Alcohol use: Yes    Comment: 40 OZ BEER DAILY  . Drug use: No  . Sexual activity: Not on file  Other Topics Concern  . Not on file  Social History Narrative  . Not on file   Allergies  Allergen Reactions  . Dilantin [Phenytoin Sodium Extended] Hives and Rash    Review of Systems  Constitutional: Negative.  Negative for fatigue, fever and malaise/fatigue.  HENT: Negative.   Eyes: Negative.  Negative for blurred vision, photophobia and visual disturbance.  Cardiovascular: Negative.  Negative for palpitations and orthopnea.  Gastrointestinal: Negative.   Endocrine: Negative.  Negative for polydipsia, polyphagia and polyuria.  Genitourinary: Negative.   Musculoskeletal: Negative for neck pain.  Skin: Negative.  Allergic/Immunologic: Negative.   Neurological: Negative.   Hematological: Negative.   Psychiatric/Behavioral: Negative.  Negative for sleep disturbance and suicidal ideas.      Objective:   Physical Exam  Constitutional: He is oriented to person, place, and time. He appears well-developed and well-nourished.  HENT:  Head: Normocephalic and atraumatic.  Right Ear: External ear normal.  Left Ear: External ear normal.  Mouth/Throat: Oropharynx is clear and moist.  Eyes: Conjunctivae and EOM are normal. Pupils are equal, round, and reactive to light.  Neck: Normal range of motion. Neck supple.   Cardiovascular: Normal rate, normal heart sounds and intact distal pulses.  Pulmonary/Chest: Effort normal and breath sounds normal.  Abdominal: Soft. Bowel sounds are normal.  Musculoskeletal:       Lumbar back: He exhibits no swelling and no spasm.  Neurological: He is alert and oriented to person, place, and time. He has normal reflexes.  Skin: Skin is warm and dry.  Psychiatric: He has a normal mood and affect. His behavior is normal. Judgment and thought content normal.      BP (!) 170/94 (BP Location: Left Arm, Patient Position: Sitting, Cuff Size: Large) Comment: manually  Pulse 77   Temp 98.1 F (36.7 C) (Oral)   Resp 16   Ht 5\' 11"  (1.803 m)   Wt 203 lb (92.1 kg)   SpO2 100%   BMI 28.31 kg/m  Assessment & Plan:  1. Essential hypertension Will start a trial of triamterene-hydrochlorothiazide 37.5-25 mg daily Will recheck blood pressure in 1 week Recommend continuing a DASH diet We have discussed target BP range and blood pressure goal. I have advised patient to check BP regularly and to call us back or report to clinic if blood pressure is consistently higher than 140/90. We discussed the importance of compliance with medical therapy and DASH diet recommended, consequences of uncontrolled hypertension discussed.  - continue current BP medications  - POCT urinalysis dip (device) - triamterene-hydrochlorothiazide (MAXZIDE-25) 37.5-25 MG tablet; Take 1 tablet by mouth daily.  Dispense: 30 tablet; Refill: 5 - cloNIDine (CATAPRES) tablet 0.1 mg - CMP and Liver  2. Other hyperlipidemia The 10-year ASCVD risk score Denman George(Goff [place] Montez HagemanJr., et al., 2013) is: 26.7%   Values used to calculate the score:     Age: [age] years     Sex: Male     Is Non-Hispanic African American: Yes     Diabetic: No     Tobacco smoker: Yes     Systolic Blood Pressure: 170 mmHg     Is BP treated: Yes     HDL Cholesterol: 66 mg/dL     Total Cholesterol: 249 mg/dL  - atorvastatin (LIPITOR) 10 MG tablet; Take  1 tablet (10 mg total) by mouth daily.  Dispense: 30 tablet; Refill: 5 - CMP and Liver  3. Prediabetes Hemoglobin a1C is 6.2, which is consistent with prediabetes.  Recommend a lowfat, low carbohydrate diet divided over 5-6 small meals, increase water intake to 6-8 glasses, and 150 minutes per week of cardiovascular exercise.    - HgB A1c  4. Tobacco dependence Smoking cessation instruction/counseling given:  counseled patient on the dangers of tobacco use, advised patient to stop smoking, and reviewed strategies to maximize success    RTC: Return to clinic in 1 week for blood pressure check. 3 months for hyperension   The patient was given clear instructions to go to ER or return to medical center if symptoms do not improve, worsen or new problems develop. The patient  verbalized understanding.   Nolon Nations  MSN, FNP-C Patient Care [facility] [address] [place], [place] [address] [phone]

## 2018-01-16 NOTE — Patient Instructions (Addendum)
Your blood pressure is above goal on current medication regimen.  We will start a trial of triamterene-hydrochlorothiazide 37.5-25 mg daily for hypertension.  We will follow-up in 1 week for blood pressure check in 3 months for hypertension.  Recommend a low-fat low-sodium diet divided over 5-6 small meals throughout the day.  Your hemoglobin A1c is 6.2 which is consistent with prediabetes.  Recommend a low impact cardiovascular exercise routine 30 minutes 5 days/week.  Also recommend that you continue atorvastatin for hyperlipidemia every evening with dinner.  Recommend smoking cessation.   Continue medication, monitor blood pressure at home. Continue DASH diet. Reminder to go to the ER if any CP, SOB, nausea, dizziness, severe HA, changes vision/speech, left arm numbness and tingling and jaw pain.     DASH Eating Plan DASH stands for "Dietary Approaches to Stop Hypertension." The DASH eating plan is a healthy eating plan that has been shown to reduce high blood pressure (hypertension). It may also reduce your risk for type 2 diabetes, heart disease, and stroke. The DASH eating plan may also help with weight loss. What are tips for following this plan? General guidelines  Avoid eating more than 2,300 mg (milligrams) of salt (sodium) a day. If you have hypertension, you may need to reduce your sodium intake to 1,500 mg a day.  Limit alcohol intake to no more than 1 drink a day for nonpregnant women and 2 drinks a day for men. One drink equals 12 oz of beer, 5 oz of wine, or 1 oz of hard liquor.  Work with your health care provider to maintain a healthy body weight or to lose weight. Ask what an ideal weight is for you.  Get at least 30 minutes of exercise that causes your heart to beat faster (aerobic exercise) most days of the week. Activities may include walking, swimming, or biking.  Work with your health care provider or diet and nutrition specialist (dietitian) to adjust your eating plan  to your individual calorie needs. Reading food labels  Check food labels for the amount of sodium per serving. Choose foods with less than 5 percent of the Daily Value of sodium. Generally, foods with less than 300 mg of sodium per serving fit into this eating plan.  To find whole grains, look for the word "whole" as the first word in the ingredient list. Shopping  Buy products labeled as "low-sodium" or "no salt added."  Buy fresh foods. Avoid canned foods and premade or frozen meals. Cooking  Avoid adding salt when cooking. Use salt-free seasonings or herbs instead of table salt or sea salt. Check with your health care provider or pharmacist before using salt substitutes.  Do not fry foods. Cook foods using healthy methods such as baking, boiling, grilling, and broiling instead.  Cook with heart-healthy oils, such as olive, canola, soybean, or sunflower oil. Meal planning   Eat a balanced diet that includes: ? 5 or more servings of fruits and vegetables each day. At each meal, try to fill half of your plate with fruits and vegetables. ? Up to 6-8 servings of whole grains each day. ? Less than 6 oz of lean meat, poultry, or fish each day. A 3-oz serving of meat is about the same size as a deck of cards. One egg equals 1 oz. ? 2 servings of low-fat dairy each day. ? A serving of nuts, seeds, or beans 5 times each week. ? Heart-healthy fats. Healthy fats called Omega-3 fatty acids are found in foods  such as flaxseeds and coldwater fish, like sardines, salmon, and mackerel.  Limit how much you eat of the following: ? Canned or prepackaged foods. ? Food that is high in trans fat, such as fried foods. ? Food that is high in saturated fat, such as fatty meat. ? Sweets, desserts, sugary drinks, and other foods with added sugar. ? Full-fat dairy products.  Do not salt foods before eating.  Try to eat at least 2 vegetarian meals each week.  Eat more home-cooked food and less  restaurant, buffet, and fast food.  When eating at a restaurant, ask that your food be prepared with less salt or no salt, if possible. What foods are recommended? The items listed may not be a complete list. Talk with your dietitian about what dietary choices are best for you. Grains Whole-grain or whole-wheat bread. Whole-grain or whole-wheat pasta. Brown rice. Orpah Cobb. Bulgur. Whole-grain and low-sodium cereals. Pita bread. Low-fat, low-sodium crackers. Whole-wheat flour tortillas. Vegetables Fresh or frozen vegetables (raw, steamed, roasted, or grilled). Low-sodium or reduced-sodium tomato and vegetable juice. Low-sodium or reduced-sodium tomato sauce and tomato paste. Low-sodium or reduced-sodium canned vegetables. Fruits All fresh, dried, or frozen fruit. Canned fruit in natural juice (without added sugar). Meat and other protein foods Skinless chicken or [place]. Ground chicken or [place]. Pork with fat trimmed off. Fish and seafood. Egg whites. Dried beans, peas, or lentils. Unsalted nuts, nut butters, and seeds. Unsalted canned beans. Lean cuts of beef with fat trimmed off. Low-sodium, lean deli meat. Dairy Low-fat (1%) or fat-free (skim) milk. Fat-free, low-fat, or reduced-fat cheeses. Nonfat, low-sodium ricotta or cottage cheese. Low-fat or nonfat yogurt. Low-fat, low-sodium cheese. Fats and oils Soft margarine without trans fats. Vegetable oil. Low-fat, reduced-fat, or light mayonnaise and salad dressings (reduced-sodium). Canola, safflower, olive, soybean, and sunflower oils. Avocado. Seasoning and other foods Herbs. Spices. Seasoning mixes without salt. Unsalted popcorn and pretzels. Fat-free sweets. What foods are not recommended? The items listed may not be a complete list. Talk with your dietitian about what dietary choices are best for you. Grains Baked goods made with fat, such as croissants, muffins, or some breads. Dry pasta or rice meal packs. Vegetables Creamed or  fried vegetables. Vegetables in a cheese sauce. Regular canned vegetables (not low-sodium or reduced-sodium). Regular canned tomato sauce and paste (not low-sodium or reduced-sodium). Regular tomato and vegetable juice (not low-sodium or reduced-sodium). Rosita Fire. Olives. Fruits Canned fruit in a light or heavy syrup. Fried fruit. Fruit in cream or butter sauce. Meat and other protein foods Fatty cuts of meat. Ribs. Fried meat. Tomasa Blase. Sausage. Bologna and other processed lunch meats. Salami. Fatback. Hotdogs. Bratwurst. Salted nuts and seeds. Canned beans with added salt. Canned or smoked fish. Whole eggs or egg yolks. Chicken or [place] with skin. Dairy Whole or 2% milk, cream, and half-and-half. Whole or full-fat cream cheese. Whole-fat or sweetened yogurt. Full-fat cheese. Nondairy creamers. Whipped toppings. Processed cheese and cheese spreads. Fats and oils Butter. Stick margarine. Lard. Shortening. Ghee. Bacon fat. Tropical oils, such as coconut, palm kernel, or palm oil. Seasoning and other foods Salted popcorn and pretzels. Onion salt, garlic salt, seasoned salt, table salt, and sea salt. Worcestershire sauce. Tartar sauce. Barbecue sauce. Teriyaki sauce. Soy sauce, including reduced-sodium. Steak sauce. Canned and packaged gravies. Fish sauce. Oyster sauce. Cocktail sauce. Horseradish that you find on the shelf. Ketchup. Mustard. Meat flavorings and tenderizers. Bouillon cubes. Hot sauce and Tabasco sauce. Premade or packaged marinades. Premade or packaged taco seasonings. Relishes. Regular salad  dressings. Where to find more information:  National Heart, Lung, and Blood Institute: PopSteam.is  American Heart Association: www.heart.org Summary  The DASH eating plan is a healthy eating plan that has been shown to reduce high blood pressure (hypertension). It may also reduce your risk for type 2 diabetes, heart disease, and stroke.  With the DASH eating plan, you should limit salt  (sodium) intake to 2,300 mg a day. If you have hypertension, you may need to reduce your sodium intake to 1,500 mg a day.  When on the DASH eating plan, aim to eat more fresh fruits and vegetables, whole grains, lean proteins, low-fat dairy, and heart-healthy fats.  Work with your health care provider or diet and nutrition specialist (dietitian) to adjust your eating plan to your individual calorie needs. This information is not intended to replace advice given to you by your health care provider. Make sure you discuss any questions you have with your health care provider. Document Released: 11/29/2011 Document Revised: 12/03/2016 Document Reviewed: 12/03/2016 Elsevier Interactive Patient Education  [employer].

## 2018-01-17 ENCOUNTER — Telehealth: Payer: Self-pay

## 2018-01-17 LAB — CMP AND LIVER
ALBUMIN: 4.8 g/dL (ref 3.5–5.5)
ALK PHOS: 83 IU/L (ref 39–117)
ALT: 20 IU/L (ref 0–44)
AST: 18 IU/L (ref 0–40)
BILIRUBIN, DIRECT: 0.1 mg/dL (ref 0.00–0.40)
BUN: 10 mg/dL (ref 6–24)
Bilirubin Total: 0.4 mg/dL (ref 0.0–1.2)
CO2: 25 mmol/L (ref 20–29)
Calcium: 10.3 mg/dL — ABNORMAL HIGH (ref 8.7–10.2)
Chloride: 104 mmol/L (ref 96–106)
Creatinine, Ser: 1.15 mg/dL (ref 0.76–1.27)
GFR calc Af Amer: 84 mL/min/{1.73_m2} (ref >59)
GFR calc non Af Amer: 72 mL/min/{1.73_m2} (ref >59)
Glucose: 108 mg/dL — ABNORMAL HIGH (ref 65–99)
POTASSIUM: 4.5 mmol/L (ref 3.5–5.2)
SODIUM: 144 mmol/L (ref 134–144)
TOTAL PROTEIN: 7.6 g/dL (ref 6.0–8.5)

## 2018-01-17 NOTE — Telephone Encounter (Signed)
-----   Message from Massie MaroonLachina M Hollis, [place]NP sent at 01/17/2018  9:34 AM EST ----- Regarding: lab results Please inform patient that all laboratory values are consistent with baseline. Will continue medications as prescribed. Please follow up in 1 week as scheduled for bp check  Thanks

## 2018-01-17 NOTE — Telephone Encounter (Signed)
Called, no answer. Will try later. Thanks!  

## 2018-01-31 ENCOUNTER — Telehealth: Payer: Self-pay

## 2018-01-31 NOTE — Telephone Encounter (Signed)
Patient called and states the medication you changed him to for bp (maxzide) has made him itch and have cramps. He states he has stopped this medication x 3 days ago and wants to know if you can give him something else. I asked that he come in for an appointment and he states [place]. Please advise. Thanks!

## 2018-02-03 ENCOUNTER — Telehealth: Payer: Self-pay | Admitting: Family Medicine

## 2018-02-03 DIAGNOSIS — I10 Essential (primary) hypertension: Secondary | ICD-10-CM

## 2018-02-03 MED ORDER — AMLODIPINE BESYLATE 5 MG PO TABS: 5.0000 mg | ORAL_TABLET | Freq: Every day | ORAL | 0 refills | Status: DC

## 2018-02-03 NOTE — Telephone Encounter (Signed)
Annamaria HellingMichael Moxon, a 52 year old patient with a history of uncontrolled hypertension was prescribed triamterene-hydrochlorothiazide on 01/16/2018. Patient states that he started itching and cramping several days after starting medication. He immediately stopped medication and symptoms dissipated. Will start amlodipine 5 mg daily. Patient is scheduled on 02/05/2018 for a blood pressure check.    Nolon NationsLachina Moore Pamela Maddy  MSN, FNP-C Patient Care [facility] [address]  [place], [place] [id number] [phone]

## 2018-02-05 ENCOUNTER — Encounter: Payer: Self-pay | Admitting: Family Medicine

## 2018-02-05 ENCOUNTER — Ambulatory Visit (INDEPENDENT_AMBULATORY_CARE_PROVIDER_SITE_OTHER): Payer: Medicaid Other | Admitting: Family Medicine

## 2018-02-05 VITALS — BP 162/92 | HR 66 | Temp 98.0°F | Resp 16 | Ht 71.0 in | Wt 202.0 lb

## 2018-02-05 DIAGNOSIS — T7840XA Allergy, unspecified, initial encounter: Secondary | ICD-10-CM | POA: Diagnosis not present

## 2018-02-05 DIAGNOSIS — I10 Essential (primary) hypertension: Secondary | ICD-10-CM

## 2018-02-05 MED ORDER — FAMOTIDINE 20 MG PO TABS: 20.0000 mg | ORAL_TABLET | Freq: Every day | ORAL | 0 refills | Status: DC

## 2018-02-05 MED ORDER — PREDNISONE 10 MG PO TABS
10.0000 mg | ORAL_TABLET | Freq: Every day | ORAL | 0 refills | Status: DC
Start: 2018-02-05 — End: 2018-03-21

## 2018-02-05 MED ORDER — HYDROXYZINE HCL 10 MG PO TABS: 10.0000 mg | ORAL_TABLET | Freq: Three times a day (TID) | ORAL | 0 refills | Status: DC | PRN

## 2018-02-05 MED ORDER — AMLODIPINE BESYLATE 10 MG PO TABS: 10.0000 mg | ORAL_TABLET | Freq: Every day | ORAL | 5 refills | Status: DC

## 2018-02-05 MED ORDER — CLONIDINE HCL 0.1 MG PO TABS
0.1000 mg | ORAL_TABLET | Freq: Once | ORAL | Status: AC
  Administered 2018-02-05: 0.1 mg via ORAL

## 2018-02-05 NOTE — Patient Instructions (Signed)
For allergic reaction we will start a taper dose of prednisone.  Today you will take 60 mg, 50 mg on day 2, 40 mg on day 3, 30 mg a day 4, 20 mg on day 5 and 10 mg on day 6.  Sent and Atarax 10 mg every 8 hours as needed for increased itching related to allergic reaction .  Also ordered Pepcid at bedtime.   Your blood pressure is markedly elevated without medication.  Recommend that we start amlodipine 10 mg daily.  He will return in 1 week for blood pressure check.

## 2018-02-08 NOTE — Progress Notes (Signed)
Troy Elliott, a 52 year old male with a history of uncontrolled hypertension presents complaining of an allergic reaction. Patient was prescribed triamterene-hydrochlorothiazide on 01/16/2018. Patient reports that several days after starting medication, a rash started on trunk, back, abdomen, and thighs. Patient describes rash as red and itching. He denies changing any soaps, lotions, laundry detergent, or colognes. He is also not taking herbal medications. Patient stopped medication immediately after rash started. Patient was prescribed amlodipine and did not pick up medication. He says that he just returned from [place] and has not had time to get to the pharmacy.   He denies fever, fatigue, dizziness, blurred vision, heart palpitations, or bilateral lower extremity edema.   Past Medical History:  Diagnosis Date  . Arthritis    hand R  . Asthma   . Bipolar 1 disorder (HCC)   . Carpal tunnel syndrome on both sides   . Depression   . GERD (gastroesophageal reflux disease)   . History of cardiac murmur as a child   . History of surgery for cerebral aneurysm    2006  right side -- clipped-- per pt no residual  . Hyperlipidemia   . Hypertension   . OA (osteoarthritis)    RIGHT SHOULDER AC JOINT  . Pre-diabetes   . Right rotator cuff tear   . Seasonal allergies   . Sickle cell trait (HCC)   . Wears glasses    Immunization History  Administered Date(s) Administered  . Tdap 09/03/2016   Social History   Socioeconomic History  . Marital status: Single    Spouse name: Not on file  . Number of children: Not on file  . Years of education: Not on file  . Highest education level: Not on file  Social Needs  . Financial resource strain: Not on file  . Food insecurity - worry: Not on file  . Food insecurity - inability: Not on file  . Transportation needs - medical: Not on file  . Transportation needs - non-medical: Not on file  Occupational History  . Not on file  Tobacco Use  .  Smoking status: Current Every Day Smoker    Packs/day: 0.50    Years: 20.00    Pack years: 10.00    Types: Cigarettes  . Smokeless tobacco: Never Used  Substance and Sexual Activity  . Alcohol use: Yes    Comment: 40 OZ BEER DAILY  . Drug use: No  . Sexual activity: Not on file  Other Topics Concern  . Not on file  Social History Narrative  . Not on file   Review of Systems  Constitutional: Negative.   HENT: Negative.   Eyes: Negative.   Respiratory: Negative.   Cardiovascular: Negative.   Gastrointestinal: Negative.   Genitourinary: Negative.   Musculoskeletal: Negative.   Skin: Positive for itching and rash.  Neurological: Negative.   Endo/Heme/Allergies: Negative.   Psychiatric/Behavioral: Negative.    Physical Exam  Constitutional: He is oriented to person, place, and time.  HENT:  Head: Normocephalic and atraumatic.  Right Ear: External ear normal.  Left Ear: External ear normal.  Nose: Nose normal.  Mouth/Throat: Oropharynx is clear and moist.  Eyes: Pupils are equal, round, and reactive to light.  Cardiovascular: Normal rate and regular rhythm.  Pulmonary/Chest: Effort normal and breath sounds normal.  Neurological: He is alert and oriented to person, place, and time.  Skin: Rash noted. Rash is urticarial.  Plan   1. Essential hypertension Blood pressure was markedly elevated to 182/96 on  arrival. Blood pressure decreased to 162/92 following Clonidine 0.1 mg.  Patient to start Amlodipine 10 mg daily.  - Continue medication, monitor blood pressure at home. Continue DASH diet. Reminder to go to the ER if any CP, SOB, nausea, dizziness, severe HA, changes vision/speech, left arm numbness and tingling and jaw pain.   He will return in 1 week for bp check - cloNIDine (CATAPRES) tablet 0.1 mg - amLODipine (NORVASC) 10 MG tablet; Take 1 tablet (10 mg total) by mouth daily.  Dispense: 30 tablet; Refill: 5  2. Allergic reaction to drug, initial encounter -  predniSONE (DELTASONE) 10 MG tablet; Take 1 tablet (10 mg total) by mouth daily with breakfast. Per 6 days tapered dose pack.  Dispense: 21 tablet; Refill: 0 - hydrOXYzine (ATARAX/VISTARIL) 10 MG tablet; Take 1 tablet (10 mg total) by mouth 3 (three) times daily as needed.  Dispense: 30 tablet; Refill: 0 - famotidine (PEPCID) 20 MG tablet; Take 1 tablet (20 mg total) by mouth at bedtime for 7 days.  Dispense: 30 tablet; Refill: 0   RTC: 1 week for bp check and 3 months for hypertension.    Troy NationsLachina Moore [name]  MSN, FNP-C Patient Care [facility] [address]  [place], [place] [address] [phone]

## 2018-02-19 ENCOUNTER — Encounter: Payer: Self-pay | Admitting: Family Medicine

## 2018-02-19 ENCOUNTER — Ambulatory Visit: Payer: Medicaid Other | Admitting: Family Medicine

## 2018-02-19 ENCOUNTER — Ambulatory Visit (INDEPENDENT_AMBULATORY_CARE_PROVIDER_SITE_OTHER): Payer: Medicaid Other | Admitting: Family Medicine

## 2018-02-19 VITALS — BP 148/96 | HR 84 | Temp 98.2°F | Resp 16 | Ht 71.0 in | Wt 196.0 lb

## 2018-02-19 DIAGNOSIS — B354 Tinea corporis: Secondary | ICD-10-CM | POA: Diagnosis not present

## 2018-02-19 DIAGNOSIS — N529 Male erectile dysfunction, unspecified: Secondary | ICD-10-CM | POA: Diagnosis not present

## 2018-02-19 DIAGNOSIS — I10 Essential (primary) hypertension: Secondary | ICD-10-CM

## 2018-02-19 MED ORDER — AMLODIPINE BESYLATE 10 MG PO TABS: 10.0000 mg | ORAL_TABLET | Freq: Every day | ORAL | 1 refills | Status: DC

## 2018-02-19 MED ORDER — SILDENAFIL CITRATE 25 MG PO TABS: 25.0000 mg | ORAL_TABLET | Freq: Every day | ORAL | 0 refills | Status: AC | PRN

## 2018-02-19 MED ORDER — HYDROCHLOROTHIAZIDE 12.5 MG PO TABS: 12.5000 mg | ORAL_TABLET | Freq: Every day | ORAL | 1 refills | Status: DC

## 2018-02-19 MED ORDER — CLOTRIMAZOLE 1 % EX CREA: 1.0000 "application " | TOPICAL_CREAM | Freq: Two times a day (BID) | CUTANEOUS | 0 refills | Status: DC

## 2018-02-19 NOTE — Progress Notes (Signed)
Troy HellingMichael Elliott, a 52 year old male with a history of uncontrolled hypertension presents for a follow up of hypertension and rash primarily to abdomen.  Patient was prescribed triamterene-hydrochlorothiazide on 01/16/2018. Patient reports that several days after starting medication, a rash started on trunk, back, abdomen, and thighs. Patient describes rash as red and itching. He denies changing any soaps, lotions, laundry detergent, or colognes. He is also not taking herbal medications. Patient stopped medication immediately after rash started. Patient was treated with a trial of steroids for rash. Rash continues and is characterized as dry and itching.   Troy Elliott has been taking antihypertensive medication consistently. He is not following a low fat diet or exercising routinely. He denies fever, fatigue, dizziness, blurred vision, heart palpitations, or bilateral lower extremity edema.   Past Medical History:  Diagnosis Date  . Arthritis    hand R  . Asthma   . Bipolar 1 disorder (HCC)   . Carpal tunnel syndrome on both sides   . Depression   . GERD (gastroesophageal reflux disease)   . History of cardiac murmur as a child   . History of surgery for cerebral aneurysm    2006  right side -- clipped-- per pt no residual  . Hyperlipidemia   . Hypertension   . OA (osteoarthritis)    RIGHT SHOULDER AC JOINT  . Pre-diabetes   . Right rotator cuff tear   . Seasonal allergies   . Sickle cell trait (HCC)   . Wears glasses    Immunization History  Administered Date(s) Administered  . Tdap 09/03/2016   Social History   Socioeconomic History  . Marital status: Single    Spouse name: Not on file  . Number of children: Not on file  . Years of education: Not on file  . Highest education level: Not on file  Social Needs  . Financial resource strain: Not on file  . Food insecurity - worry: Not on file  . Food insecurity - inability: Not on file  . Transportation needs - medical: Not on  file  . Transportation needs - non-medical: Not on file  Occupational History  . Not on file  Tobacco Use  . Smoking status: Current Every Day Smoker    Packs/day: 0.50    Years: 20.00    Pack years: 10.00    Types: Cigarettes  . Smokeless tobacco: Never Used  Substance and Sexual Activity  . Alcohol use: Yes    Comment: 40 OZ BEER DAILY  . Drug use: No  . Sexual activity: Not on file  Other Topics Concern  . Not on file  Social History Narrative  . Not on file   Review of Systems  Constitutional: Negative.   HENT: Negative.   Eyes: Negative.   Respiratory: Negative.   Cardiovascular: Negative.   Gastrointestinal: Negative.   Genitourinary: Negative.   Musculoskeletal: Negative.   Skin: Positive for itching and rash.  Neurological: Negative.   Endo/Heme/Allergies: Negative.   Psychiatric/Behavioral: Negative.    Physical Exam  Constitutional: He is oriented to person, place, and time.  HENT:  Head: Normocephalic and atraumatic.  Right Ear: External ear normal.  Left Ear: External ear normal.  Nose: Nose normal.  Mouth/Throat: Oropharynx is clear and moist.  Eyes: Pupils are equal, round, and reactive to light.  Cardiovascular: Normal rate and regular rhythm.  Pulmonary/Chest: Effort normal and breath sounds normal.  Neurological: He is alert and oriented to person, place, and time.  Skin: Rash noted.  Circular, dry, scaly rash, primarily to abdomen  Plan  BP (!) 148/96 (BP Location: Left Arm, Patient Position: Sitting, Cuff Size: Normal) Comment: manually  Pulse 84   Temp 98.2 F (36.8 C) (Oral)   Resp 16   Ht 5\' 11"  (1.803 m)   Wt 196 lb (88.9 kg)   SpO2 100%   BMI 27.34 kg/m     1. Tinea corporis  I suspect that patient has tinea corporis. Will start a trial of clotrimazole BID for 10 days.  - clotrimazole (LOTRIMIN) 1 % cream; Apply 1 application topically 2 (two) times daily for 10 days.  Dispense: 60 g; Refill: 0 - Ambulatory referral to  Dermatology  2. Essential hypertension Blood pressure is above goal on current medication regimen. Will start a trial of thiazide diuretic.  - Continue medication, monitor blood pressure at home. Continue DASH diet. Reminder to go to the ER if any CP, SOB, nausea, dizziness, severe HA, changes vision/speech, left arm numbness and tingling and jaw pain.  - amLODipine (NORVASC) 10 MG tablet; Take 1 tablet (10 mg total) by mouth daily.  Dispense: 90 tablet; Refill: 1 - hydrochlorothiazide (HYDRODIURIL) 12.5 MG tablet; Take 1 tablet (12.5 mg total) by mouth daily.  Dispense: 90 tablet; Refill: 1  3. Erectile dysfunction, unspecified erectile dysfunction type - PSA - sildenafil (VIAGRA) 25 MG tablet; Take 1 tablet (25 mg total) by mouth daily as needed for erectile dysfunction.  Dispense: 10 tablet; Refill: 0    Nolon Nations  MSN, FNP-C   Patient Care [facility] [address] [place], [place] [address] [phone]

## 2018-02-19 NOTE — Patient Instructions (Signed)
Return in 4 weeks for blood pressure check upon [place] from [place].  Your blood pressure has improved on current medication regimen. Added hydrochlorothizide for further blood pressure control. - Continue medication, monitor blood pressure at home. Continue DASH diet. Reminder to go to the ER if any CP, SOB, nausea, dizziness, severe HA, changes vision/speech, left arm numbness and tingling and jaw pain.    The patient desires Viagra to treat his erectile dysfunction. History and physical exam has not disclosed any obvious treatable cause of this complaint. He is informed that Viagra is sometimes not covered by insurance. It is available on a fee-for-service cost basis, and is relatively expensive. He can start with 25 mg dose.  The method of use 1 hour prior to anticipated intercourse is explained. He should not use any more than one tablet in a 24 hour period. The side effects of possible headache, flushing, dyspepsia and transient changes in vision have been explained.  The patient is not taking nitrates, and denies he has access to nitrates in any form at any time. I have counseled him that taking Viagra with nitrates of any form can cause death. Additionally, Viagra serum concentrations can be increased by the following: cimetidine, erythromycin, itraconazole or ketoconazole. This patient does not take these drugs, but I have counseled him to avoid Viagra if he does take any of these.  We have also discussed the fact that there have been some deaths in patients after taking Viagra, felt due to the exertion of intercourse rather than the drug itself. The patient is aware of this, and accepts whatever unknown degree of risk there is in this aspect.

## 2018-02-20 LAB — POCT URINALYSIS DIP (DEVICE)
BILIRUBIN URINE: NEGATIVE
GLUCOSE, UA: NEGATIVE mg/dL
Hgb urine dipstick: NEGATIVE
Ketones, ur: NEGATIVE mg/dL
LEUKOCYTES UA: NEGATIVE
NITRITE: NEGATIVE
Protein, ur: NEGATIVE mg/dL
SPECIFIC GRAVITY, URINE: 1.015 (ref 1.005–1.030)
UROBILINOGEN UA: 0.2 mg/dL (ref 0.0–1.0)
pH: 5 (ref 5.0–8.0)

## 2018-02-20 LAB — PSA: Prostate Specific Ag, Serum: 0.9 ng/mL (ref 0.0–4.0)

## 2018-03-21 ENCOUNTER — Other Ambulatory Visit: Payer: Self-pay | Admitting: Family Medicine

## 2018-03-21 DIAGNOSIS — I10 Essential (primary) hypertension: Secondary | ICD-10-CM

## 2018-03-21 DIAGNOSIS — T7840XA Allergy, unspecified, initial encounter: Secondary | ICD-10-CM

## 2018-03-21 DIAGNOSIS — B354 Tinea corporis: Secondary | ICD-10-CM

## 2018-03-21 DIAGNOSIS — E7849 Other hyperlipidemia: Secondary | ICD-10-CM

## 2018-03-31 ENCOUNTER — Ambulatory Visit (INDEPENDENT_AMBULATORY_CARE_PROVIDER_SITE_OTHER): Payer: Medicaid Other | Admitting: Family Medicine

## 2018-03-31 VITALS — BP 150/84 | HR 73 | Temp 97.9°F | Resp 16 | Ht 71.0 in | Wt 199.0 lb

## 2018-03-31 DIAGNOSIS — J301 Allergic rhinitis due to pollen: Secondary | ICD-10-CM | POA: Diagnosis not present

## 2018-03-31 DIAGNOSIS — F17200 Nicotine dependence, unspecified, uncomplicated: Secondary | ICD-10-CM | POA: Diagnosis not present

## 2018-03-31 DIAGNOSIS — E7849 Other hyperlipidemia: Secondary | ICD-10-CM | POA: Diagnosis not present

## 2018-03-31 DIAGNOSIS — I10 Essential (primary) hypertension: Secondary | ICD-10-CM

## 2018-03-31 LAB — POCT URINALYSIS DIPSTICK
BILIRUBIN UA: NEGATIVE
Glucose, UA: NEGATIVE
KETONES UA: NEGATIVE
Leukocytes, UA: NEGATIVE
NITRITE UA: NEGATIVE
PH UA: 5.5 (ref 5.0–8.0)
PROTEIN UA: NEGATIVE
RBC UA: NEGATIVE
SPEC GRAV UA: 1.015 (ref 1.010–1.025)
UROBILINOGEN UA: 0.2 U/dL

## 2018-03-31 MED ORDER — AMLODIPINE BESYLATE 10 MG PO TABS
10.0000 mg | ORAL_TABLET | Freq: Every day | ORAL | 5 refills | Status: AC
Start: 2018-03-31 — End: ?

## 2018-03-31 MED ORDER — CETIRIZINE HCL 10 MG PO TABS: 10.0000 mg | ORAL_TABLET | Freq: Every day | ORAL | 0 refills | Status: DC

## 2018-03-31 MED ORDER — HYDROCHLOROTHIAZIDE 25 MG PO TABS: 25.0000 mg | ORAL_TABLET | Freq: Every day | ORAL | 1 refills | Status: AC

## 2018-03-31 MED ORDER — ATORVASTATIN CALCIUM 10 MG PO TABS: 10.0000 mg | ORAL_TABLET | Freq: Every day | ORAL | 0 refills | Status: AC

## 2018-03-31 MED ORDER — FLUTICASONE PROPIONATE 50 MCG/ACT NA SUSP: 2.0000 | Freq: Every day | NASAL | 6 refills | Status: AC

## 2018-03-31 NOTE — Progress Notes (Signed)
Subjective:    Troy Elliott, a 52 year old male with a history of hypertension, GERD, prediabetes, hyperlipidemia, and tobacco dependence presents for a follow-up.  Patient was seen in office 1 month ago for uncontrolled hypertension, medications were adjusted at that time.  He states that blood pressures have been controlled at home.  He is not following an exercise routine or low-fat, low-sodium diet.  He currently denies headache, dizziness, syncope, dysuria, or bilateral lower extremity edema.  Patient's cardiac risk factors include being a chronic everyday smoker and history of dyslipidemia..   Past Medical History:  Diagnosis Date  . Arthritis    hand R  . Asthma   . Bipolar 1 disorder (HCC)   . Carpal tunnel syndrome on both sides   . Depression   . GERD (gastroesophageal reflux disease)   . History of cardiac murmur as a child   . History of surgery for cerebral aneurysm    2006  right side -- clipped-- per pt no residual  . Hyperlipidemia   . Hypertension   . OA (osteoarthritis)    RIGHT SHOULDER AC JOINT  . Pre-diabetes   . Right rotator cuff tear   . Seasonal allergies   . Sickle cell trait (HCC)   . Wears glasses    Social History   Socioeconomic History  . Marital status: Single    Spouse name: Not on file  . Number of children: Not on file  . Years of education: Not on file  . Highest education level: Not on file  Occupational History  . Not on file  Social Needs  . Financial resource strain: Not on file  . Food insecurity:    Worry: Not on file    Inability: Not on file  . Transportation needs:    Medical: Not on file    Non-medical: Not on file  Tobacco Use  . Smoking status: Current Every Day Smoker    Packs/day: 0.50    Years: 20.00    Pack years: 10.00    Types: Cigarettes  . Smokeless tobacco: Never Used  Substance and Sexual Activity  . Alcohol use: Yes    Comment: 40 OZ BEER DAILY  . Drug use: No  . Sexual activity: Not on file   Lifestyle  . Physical activity:    Days per week: Not on file    Minutes per session: Not on file  . Stress: Not on file  Relationships  . Social connections:    Talks on phone: Not on file    Gets together: Not on file    Attends religious service: Not on file    Active member of club or organization: Not on file    Attends meetings of clubs or organizations: Not on file    Relationship status: Not on file  . Intimate partner violence:    Fear of current or ex partner: Not on file    Emotionally abused: Not on file    Physically abused: Not on file    Forced sexual activity: Not on file  Other Topics Concern  . Not on file  Social History Narrative  . Not on file    Review of Systems  Review of Systems  HENT: Negative.   Eyes: Negative.   Respiratory: Negative.   Cardiovascular: Negative.   Gastrointestinal: Negative.   Genitourinary: Negative.   Musculoskeletal: Negative.   Skin: Negative.   Neurological: Negative.   Endo/Heme/Allergies: Negative.   Psychiatric/Behavioral: Negative.    Objective:  Physical  Exam  Constitutional: He appears well-developed and well-nourished.  Cardiovascular: Normal rate, regular rhythm, normal heart sounds and intact distal pulses.  Pulmonary/Chest: Effort normal and breath sounds normal.  Abdominal: Soft. Bowel sounds are normal.  Musculoskeletal: Normal range of motion.  Neurological: He is alert.  Skin: Skin is warm and dry.  Psychiatric: He has a normal mood and affect. His behavior is normal. Judgment and thought content normal.    Assessment:   BP (!) 150/84 (BP Location: Left Arm, Patient Position: Sitting, Cuff Size: Large)   Pulse 73   Temp 97.9 F (36.6 C) (Oral)   Resp 16   Ht 5\' 11"  (1.803 m)   Wt 199 lb (90.3 kg)   SpO2 100%   BMI 27.75 kg/m   Plan:  1. Essential hypertension Blood pressure is above goal on current medication regimen.  Will increase hydrochlorothiazide to 25 mg daily.  Patient advised to  follow-up in 1 week for blood pressure check.  Patient is in the [place] of [place] to [place].  Advised to find a primary care provider to manage hypertension and hyperlipidemia. Review previous labs, renal functioning within normal range. Reviewed urinalysis, no proteinuria present - Urinalysis Dipstick - hydrochlorothiazide (HYDRODIURIL) 25 MG tablet; Take 1 tablet (25 mg total) by mouth daily.  Dispense: 90 tablet; Refill: 1 - amLODipine (NORVASC) 10 MG tablet; Take 1 tablet (10 mg total) by mouth daily.  Dispense: 90 tablet; Refill: 5  2. Other hyperlipidemia The 10-year ASCVD risk score Denman George(Goff DC Montez HagemanJr., et al., 2013) is: 21.6%   Values used to calculate the score:     Age: 89 years     Sex: Male     Is Non-Hispanic African American: Yes     Diabetic: No     Tobacco smoker: Yes     Systolic Blood Pressure: 150 mmHg     Is BP treated: Yes     HDL Cholesterol: 66 mg/dL     Total Cholesterol: 249 mg/dL  - atorvastatin (LIPITOR) 10 MG tablet; Take 1 tablet (10 mg total) by mouth daily.  Dispense: 90 tablet; Refill: 0  3. Seasonal allergic rhinitis due to pollen - cetirizine (ZYRTEC) 10 MG tablet; Take 1 tablet (10 mg total) by mouth daily.  Dispense: 90 tablet; Refill: 0 - fluticasone (FLONASE) 50 MCG/ACT nasal spray; Place 2 sprays into both nostrils daily.  Dispense: 16 g; Refill: 6  4. Tobacco dependence Smoking cessation instruction/counseling given:  counseled patient on the dangers of tobacco use, advised patient to stop smoking, and reviewed strategies to maximize success       Dietary sodium restriction. Regular aerobic exercise. Check blood pressures daily and record.     RTC: Patient will return in 1 week for blood pressure check.  No further appointments scheduled, [place]   Berwyn Bigley Rennis PettyMoore Shuntavia Yerby  MSN, FNP-C Patient [facility] [address]  [place], [place] [id number] [phone]

## 2018-04-01 ENCOUNTER — Encounter: Payer: Self-pay | Admitting: Family Medicine

## 2018-04-07 ENCOUNTER — Telehealth: Payer: Self-pay

## 2018-04-07 ENCOUNTER — Other Ambulatory Visit: Payer: Self-pay | Admitting: Family Medicine

## 2018-04-07 MED ORDER — CLOTRIMAZOLE-BETAMETHASONE 1-0.05 % EX CREA: TOPICAL_CREAM | CUTANEOUS | 1 refills | Status: DC

## 2018-04-07 NOTE — Telephone Encounter (Signed)
Did you prescribe a cream for patient?

## 2018-04-07 NOTE — Progress Notes (Signed)
Meds ordered this encounter  Medications  . clotrimazole-betamethasone (LOTRISONE) cream    Sig: Apply to affected area 2 times daily    Dispense:  15 g    Refill:  1    Nolon NationsLachina Moore Norvel Wenker  MSN, FNP-C Patient [facility] [address]  [place], [place] [id number] [phone]

## 2018-04-17 ENCOUNTER — Ambulatory Visit: Payer: Medicaid Other | Admitting: Family Medicine

## 2018-04-25 ENCOUNTER — Other Ambulatory Visit: Payer: Self-pay

## 2018-04-25 DIAGNOSIS — J301 Allergic rhinitis due to pollen: Secondary | ICD-10-CM

## 2018-04-25 MED ORDER — CETIRIZINE HCL 10 MG PO TABS: 10.0000 mg | ORAL_TABLET | Freq: Every day | ORAL | 0 refills | Status: AC

## 2018-05-22 ENCOUNTER — Ambulatory Visit (INDEPENDENT_AMBULATORY_CARE_PROVIDER_SITE_OTHER): Payer: Medicaid Other | Admitting: Family Medicine

## 2018-05-22 ENCOUNTER — Other Ambulatory Visit: Payer: Self-pay | Admitting: Family Medicine

## 2018-05-22 ENCOUNTER — Encounter: Payer: Self-pay | Admitting: Family Medicine

## 2018-05-22 VITALS — BP 140/82 | HR 82 | Temp 97.9°F | Resp 16 | Ht 71.0 in | Wt 200.0 lb

## 2018-05-22 DIAGNOSIS — J4520 Mild intermittent asthma, uncomplicated: Secondary | ICD-10-CM | POA: Diagnosis not present

## 2018-05-22 DIAGNOSIS — F17200 Nicotine dependence, unspecified, uncomplicated: Secondary | ICD-10-CM

## 2018-05-22 DIAGNOSIS — R220 Localized swelling, mass and lump, head: Secondary | ICD-10-CM

## 2018-05-22 DIAGNOSIS — I10 Essential (primary) hypertension: Secondary | ICD-10-CM

## 2018-05-22 DIAGNOSIS — K0889 Other specified disorders of teeth and supporting structures: Secondary | ICD-10-CM

## 2018-05-22 LAB — POCT URINALYSIS DIPSTICK
GLUCOSE UA: NEGATIVE
LEUKOCYTES UA: NEGATIVE
Nitrite, UA: NEGATIVE
Protein, UA: POSITIVE — AB
RBC UA: NEGATIVE
Spec Grav, UA: >=1.03 — AB (ref 1.010–1.025)
Urobilinogen, UA: 0.2 E.U./dL
pH, UA: 5 (ref 5.0–8.0)

## 2018-05-22 MED ORDER — AMOXICILLIN 500 MG PO CAPS: 500.0000 mg | ORAL_CAPSULE | Freq: Three times a day (TID) | ORAL | 0 refills | Status: AC

## 2018-05-22 MED ORDER — ALBUTEROL SULFATE HFA 108 (90 BASE) MCG/ACT IN AERS: INHALATION_SPRAY | RESPIRATORY_TRACT | 2 refills | Status: AC

## 2018-05-22 MED ORDER — IBUPROFEN 600 MG PO TABS: 600.0000 mg | ORAL_TABLET | Freq: Three times a day (TID) | ORAL | 0 refills | Status: AC | PRN

## 2018-05-22 NOTE — Patient Instructions (Signed)
  Recommend that you schedule an appointment with your dentist for further work-up and evaluation of dental abscess.  We will start a trial of amoxicillin 500 mg 3 times a day for 7 days.  Also, ibuprofen 600 mg every 8 hours as needed for mild to moderate pain associated with dental abscess.  Your blood pressure is at goal on current medication regimen, no changes warranted.   - Continue medication, monitor blood pressure at home. Continue DASH diet. Reminder to go to the ER if any CP, SOB, nausea, dizziness, severe HA, changes vision/speech, left arm numbness and tingling and jaw pain.    Dental Pain Dental pain may be caused by many things, including:  Tooth decay (cavities or caries). Cavities cause the nerve of your tooth to be open to air and hot or cold temperatures. This can cause pain or discomfort.  Abscess or infection. A dental abscess is an area that is full of infected pus from a bacterial infection in the inner part of the tooth (pulp). It usually happens at the end of the tooth's root.  Injury.  An unknown reason (idiopathic).  Your pain may be mild or severe. It may only happen when:  You are chewing.  You are exposed to hot or cold temperature.  You are eating or drinking sugary foods or beverages, such as: ? Soda. ? Candy.  Your pain may also be there all of the time. Follow these instructions at home: Watch your dental pain for any changes. Do these things to lessen your discomfort:  Take medicines only as told by your dentist.  If your dentist tells you to take an antibiotic medicine, finish all of it even if you start to feel better.  Keep all follow-up visits as told by your dentist. This is important.  Do not apply heat to the outside of your face.  Rinse your mouth or gargle with salt water if told by your dentist. This helps with pain and swelling. ? You can make salt water by adding  tsp of salt to 1 cup of warm water.  Apply ice to the  painful area of your face: ? Put ice in a plastic bag. ? Place a towel between your skin and the bag. ? Leave the ice on for 20 minutes, 2-3 times per day.  Avoid foods or drinks that cause you pain, such as: ? Very hot or very cold foods or drinks. ? Sweet or sugary foods or drinks.  Contact a doctor if:  Your pain is not helped with medicines.  Your symptoms are worse.  You have new symptoms. Get help right away if:  You cannot open your mouth.  You are having trouble breathing or swallowing.  You have a fever.  Your face, neck, or jaw is puffy (swollen). This information is not intended to replace advice given to you by your health care provider. Make sure you discuss any questions you have with your health care provider. Document Released: 05/28/2008 Document Revised: 05/17/2016 Document Reviewed: 12/06/2014 Elsevier Interactive Patient Education  [employer].

## 2018-05-22 NOTE — Progress Notes (Signed)
Subjective:    Patient ID: Troy Elliott, male    DOB: [birth year], 52 y.o.   MRN: [id number]  HPI Louden Houseworth, a 52 year old male with a history of asthma, bipolar disease, and hypertension presents for follow-up of chronic conditions.  Patient states that he has been taking blood pressure medication and consistently over the past several weeks.  He does not exercise routinely or follow a low-fat, low-sodium diet. Body mass index is 27.89 kg/m.  Patient denies headache, chest pain, palpitations, shortness of breath, fatigue, nausea, vomiting, diarrhea. Patient is also complaining of dental pain.  Dentalgia has been present over the past several weeks.  Patient is traveling for work and has been unable to schedule appointment with dentist.  Current pain intensity is 7/10 characterized as intermittent and throbbing.  Patient also endorses right facial swelling.  He denies fever, headache, dizziness.  Patient also endorses sensitivity to heat/cold., Past Medical History:  Diagnosis Date  . Arthritis    hand R  . Asthma   . Bipolar 1 disorder (HCC)   . Carpal tunnel syndrome on both sides   . Depression   . GERD (gastroesophageal reflux disease)   . History of cardiac murmur as a child   . History of surgery for cerebral aneurysm    2006  right side -- clipped-- per pt no residual  . Hyperlipidemia   . Hypertension   . OA (osteoarthritis)    RIGHT SHOULDER AC JOINT  . Pre-diabetes   . Right rotator cuff tear   . Seasonal allergies   . Sickle cell trait (HCC)   . Wears glasses    Social History   Socioeconomic History  . Marital status: Single    Spouse name: Not on file  . Number of children: Not on file  . Years of education: Not on file  . Highest education level: Not on file  Occupational History  . Not on file  Social Needs  . Financial resource strain: Not on file  . Food insecurity:    Worry: Not on file    Inability: Not on file  . Transportation needs:   Medical: Not on file    Non-medical: Not on file  Tobacco Use  . Smoking status: Current Every Day Smoker    Packs/day: 0.50    Years: 20.00    Pack years: 10.00    Types: Cigarettes  . Smokeless tobacco: Never Used  Substance and Sexual Activity  . Alcohol use: Yes    Comment: 40 OZ BEER DAILY  . Drug use: No  . Sexual activity: Not on file  Lifestyle  . Physical activity:    Days per week: Not on file    Minutes per session: Not on file  . Stress: Not on file  Relationships  . Social connections:    Talks on phone: Not on file    Gets together: Not on file    Attends religious service: Not on file    Active member of club or organization: Not on file    Attends meetings of clubs or organizations: Not on file    Relationship status: Not on file  . Intimate partner violence:    Fear of current or ex partner: Not on file    Emotionally abused: Not on file    Physically abused: Not on file    Forced sexual activity: Not on file  Other Topics Concern  . Not on file  Social History Narrative  . Not on  file   Immunization History  Administered Date(s) Administered  . Tdap 09/03/2016   Review of Systems  HENT: Positive for dental problem.        Right facial swelling  Respiratory: Negative.   Cardiovascular: Negative.   Gastrointestinal: Negative.   Endocrine: Negative for polydipsia, polyphagia and polyuria.  Musculoskeletal: Negative.   Hematological: Negative.   Psychiatric/Behavioral: Negative.        Objective:   Physical Exam  Constitutional: He is oriented to person, place, and time. He appears well-developed and well-nourished.  HENT:  Head: Normocephalic.  Mouth/Throat: Abnormal dentition. Dental caries present.  Right facial swelling, multiple dental caries, erythema to right upper gums  Eyes: Pupils are equal, round, and reactive to light.  Neck: Normal range of motion.  Cardiovascular: Normal rate, regular rhythm, normal heart sounds and intact  distal pulses.  Pulmonary/Chest: Effort normal and breath sounds normal.  Abdominal: Soft. Bowel sounds are normal.  Musculoskeletal: Normal range of motion.  Neurological: He is alert and oriented to person, place, and time.  Skin: Skin is warm and dry.  Psychiatric: He has a normal mood and affect. His behavior is normal. Judgment and thought content normal.      BP 140/82 (BP Location: Left Arm, Patient Position: Sitting, Cuff Size: Normal) Comment: manually  Pulse 82   Temp 97.9 F (36.6 C) (Oral)   Resp 16   Ht  (1.803 m)   Wt 200 lb (90.7 kg)   SpO2 100%   BMI 27.89 kg/m  Assessment & Plan:  1. Uncontrolled hypertension Blood pressure is at goal on current medication regimen, no changes warranted on today. We will review renal functioning as results become available - Urinalysis Dipstick -- Basic Metabolic Panel  2. Tobacco dependence Patient is a chronic everyday tobacco user, he is not ready to quit at this point.  Discussed smoking cessation at length.  3. Dentalgia Advised patient to schedule follow-up with dentist, first available appointment. - amoxicillin (AMOXIL) 500 MG capsule; Take 1 capsule (500 mg total) by mouth 3 (three) times daily for 7 days.  Dispense: 21 capsule; Refill: 0 - ibuprofen (ADVIL,MOTRIN) 600 MG tablet; Take 1 tablet (600 mg total) by mouth every 8 (eight) hours as needed.  Dispense: 30 tablet; Refill: 0  4. Right facial swelling - amoxicillin (AMOXIL) 500 MG capsule; Take 1 capsule (500 mg total) by mouth 3 (three) times daily for 7 days.  Dispense: 21 capsule; Refill: 0 - ibuprofen (ADVIL,MOTRIN) 600 MG tablet; Take 1 tablet (600 mg total) by mouth every 8 (eight) hours as needed.  Dispense: 30 tablet; Refill: 0  5. Mild intermittent asthma, unspecified whether complicated  albuterol (VENTOLIN HFA) 108 (90 Base) MCG/ACT inhaler; INHALE 2 PUFFS INTO THE LUNGS EVERY 6 HOURS AS NEEDED FOR WHEEZING OR SHORTNESS OF BREATH.  Dispense: 18 g;  Refill: 2   Nolon Nations  MSN, FNP-C Patient Care [facility] [address] [place], [place] [address] [phone]

## 2018-05-23 LAB — BASIC METABOLIC PANEL
BUN/Creatinine Ratio: 6 — ABNORMAL LOW (ref 9–20)
BUN: 8 mg/dL (ref 6–24)
CALCIUM: 10.4 mg/dL — AB (ref 8.7–10.2)
CO2: 22 mmol/L (ref 20–29)
Chloride: 101 mmol/L (ref 96–106)
Creatinine, Ser: 1.24 mg/dL (ref 0.76–1.27)
GFR calc Af Amer: 76 mL/min/{1.73_m2} (ref >59)
GFR calc non Af Amer: 66 mL/min/{1.73_m2} (ref >59)
Glucose: 106 mg/dL — ABNORMAL HIGH (ref 65–99)
POTASSIUM: 4 mmol/L (ref 3.5–5.2)
Sodium: 140 mmol/L (ref 134–144)

## 2018-09-13 IMAGING — XA DG FLUORO GUIDE NDL PLC/BX
3 series · 3 of 3 positions shown · non-contrast
Comparison: none

CLINICAL DATA: Pain and limited range of motion.

[Series 1: ortho standard · 1 of 1 slices shown (1 of 3)]
[im 1/1]
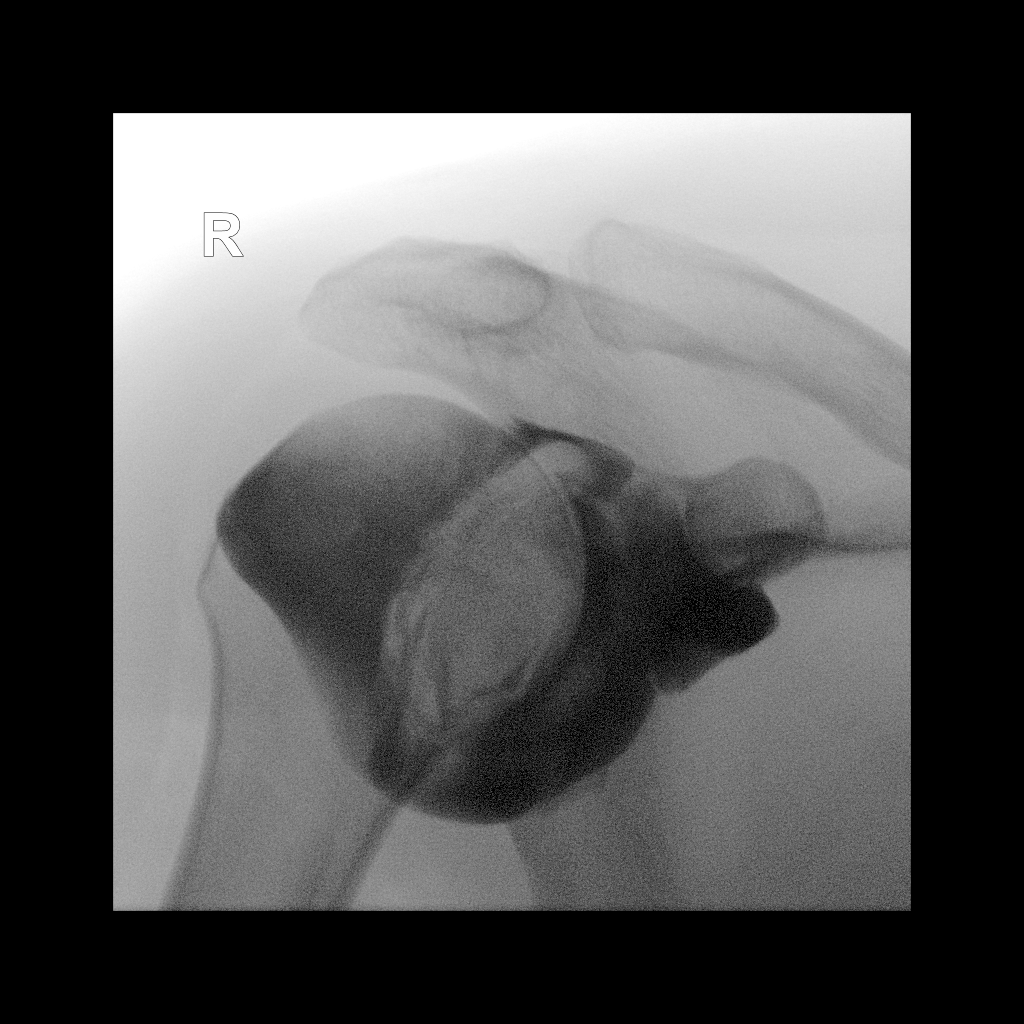

[Series 2: ortho standard · 1 of 1 slices shown (2 of 3)]
[im 1/1]
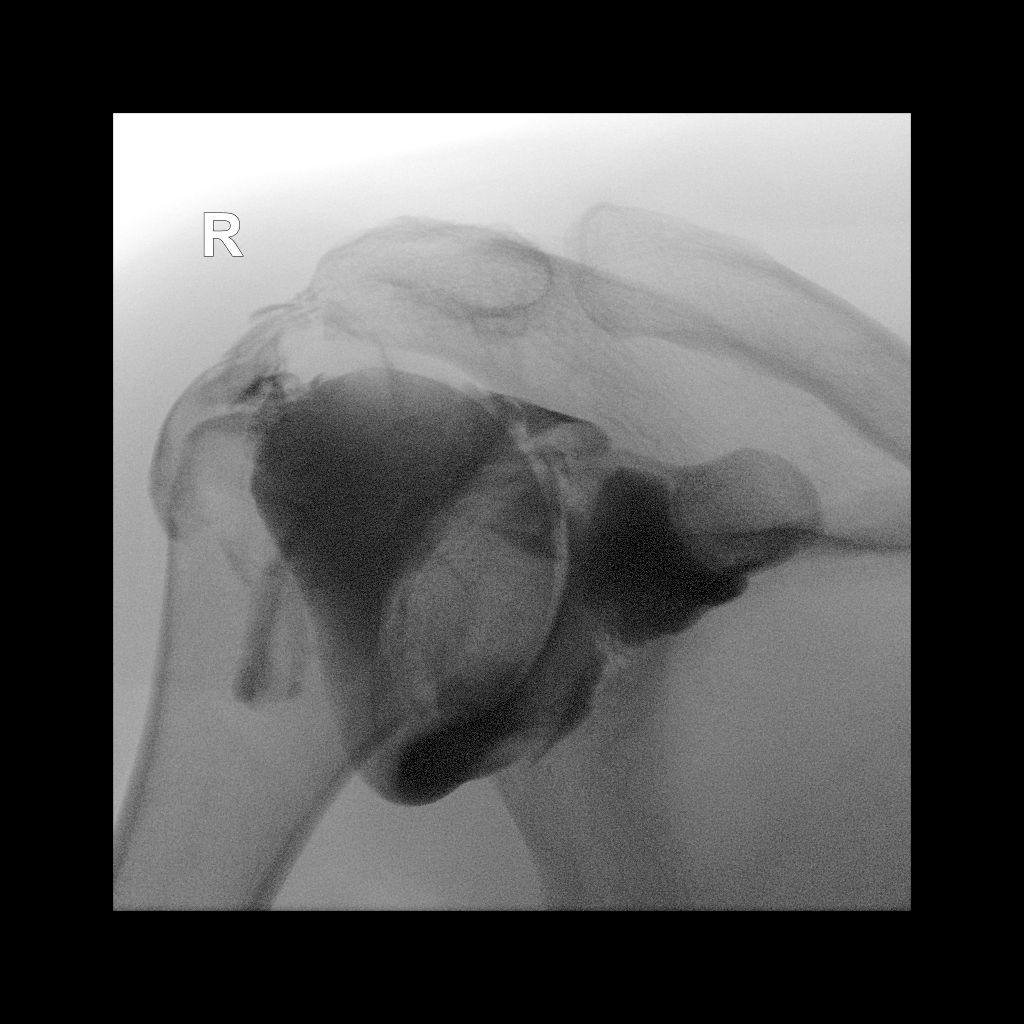

[Series 3: ortho standard · 1 of 1 slices shown (3 of 3)]
[im 1/1]
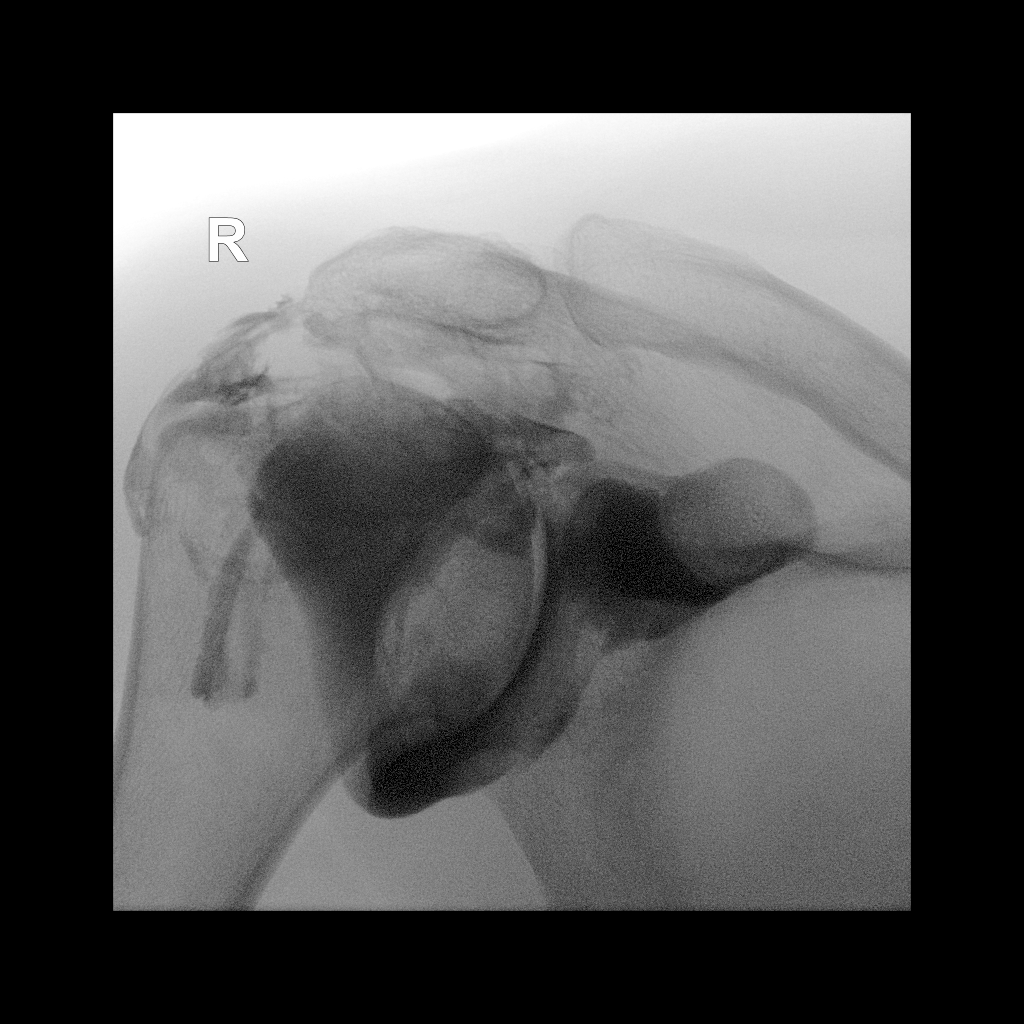

[3 of 3 positions shown; findings below may reference images not displayed]

FLUOROSCOPY TIME:  0 minutes 32 seconds. 32.66 micro gray meter
squared

PROCEDURE:
Right SHOULDER INJECTION UNDER FLUOROSCOPY

The skin overlying the shoulder was scrubbed with Betadine and
draped in sterile fashion. Skin and subcutaneous anesthesia was
carried out using a 25 gauge needle and 1% lidocaine. A 22 gauge
spinal needle was directed under fluoroscopic guidance on one pass
into the glenohumeral joint. 20 cc of dilute Isovue 200 was then
used to fill the glenohumeral joint. Limited filming shows a
full-thickness tear of the distal supraspinatus tendon.
IMPRESSION: Technically successful right shoulder injection for CT.
Full-thickness distal rotator cuff tear.

## 2018-09-13 IMAGING — CT CT SHOULDER*R* W/CM
2 of 4 series · 10 of 20 positions shown, 13 images · non-contrast
Comparison: Radiographs dated 09/10/2016 and injection images dated
03/25/2017

CLINICAL DATA: Right shoulder pain.

EXAM:
CT ARTHROGRAPHY OF THE RIGHT SHOULDER
TECHNIQUE: Multidetector CT imaging was performed following the standard
protocol after injection of dilute contrast into the joint.

[Series 5: thin soft · axial · 0.52mm/px · z∈[+553,+701]mm · 9 of 310 slices shown, 12 images]
[im 31/310  soft-tissue]
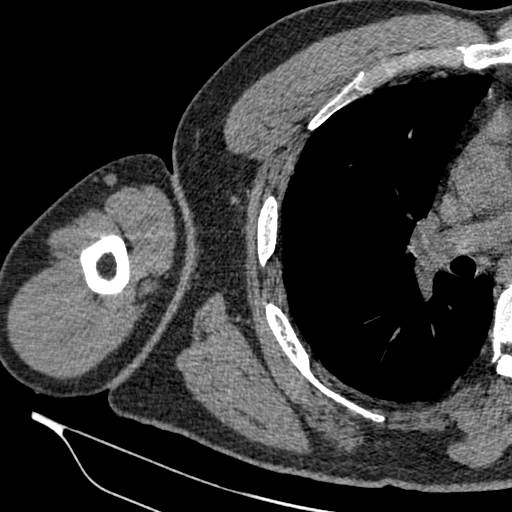
[im 31/310  bone]
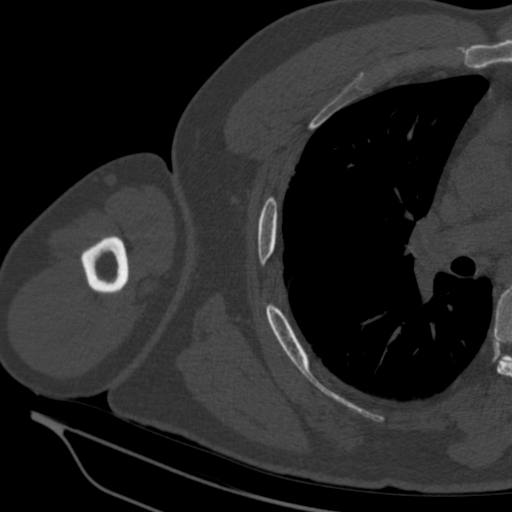
[im 62/310  bone]
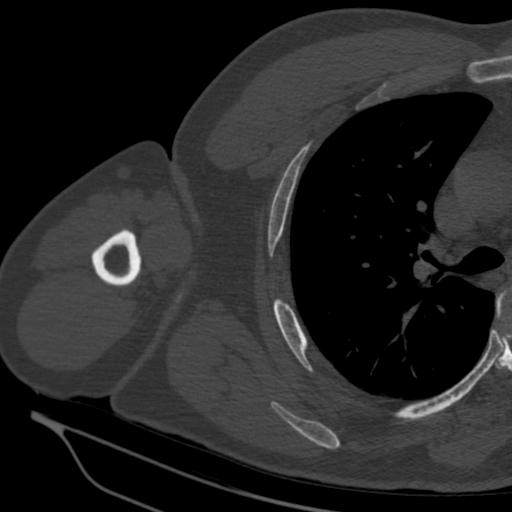
[im 93/310  bone]
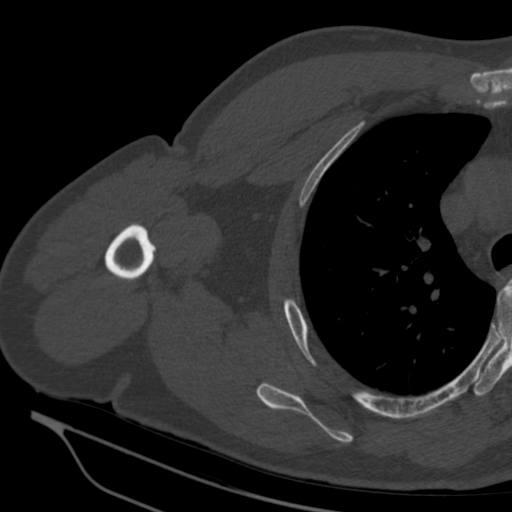
[im 124/310  bone]
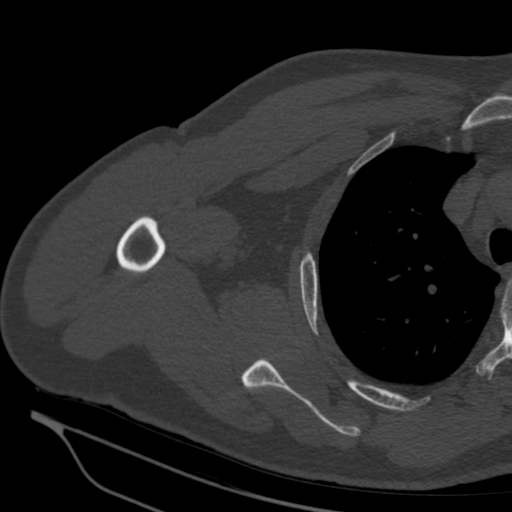
[im 155/310  soft-tissue]
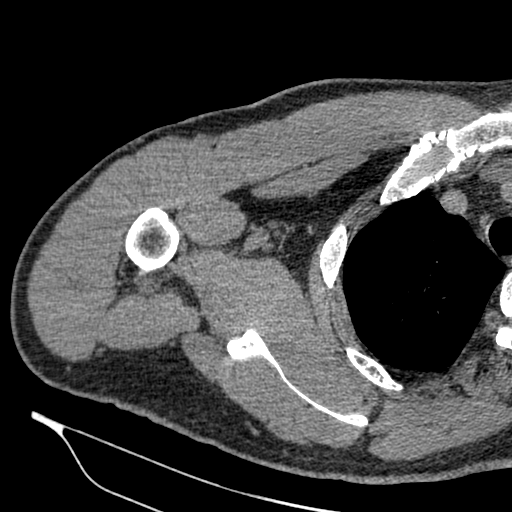
[im 155/310  bone]
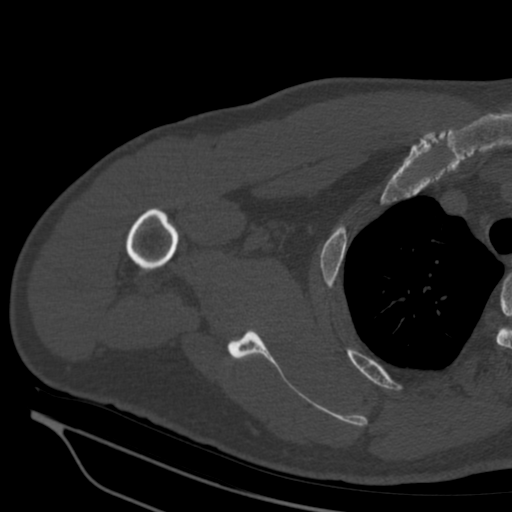
[im 186/310  bone]
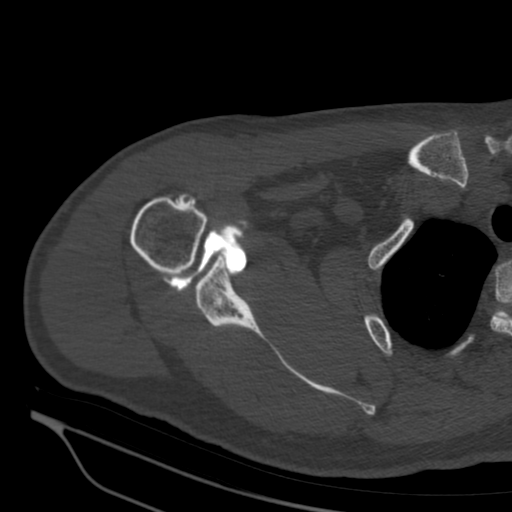
[im 217/310  bone]
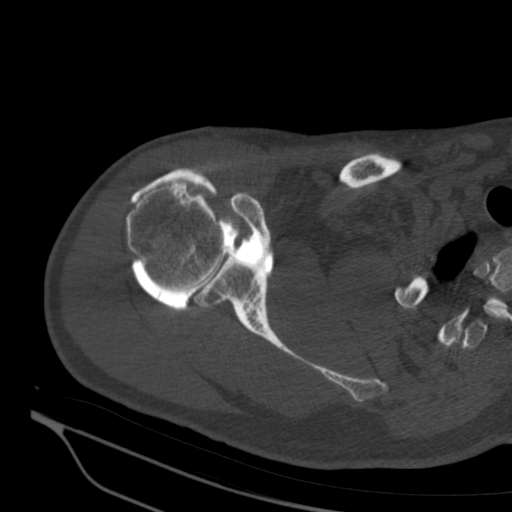
[im 248/310  bone]
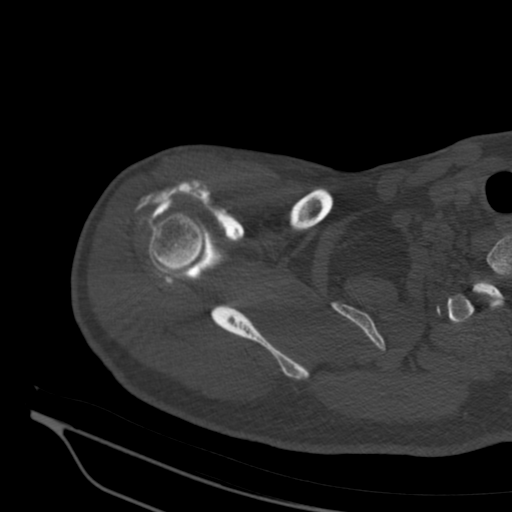
[im 279/310  soft-tissue]
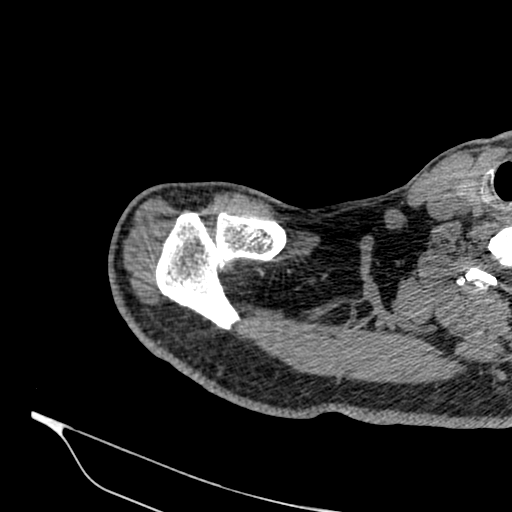
[im 279/310  bone]
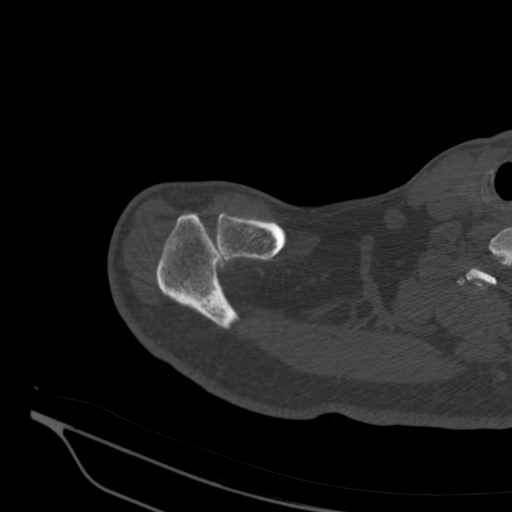

[Series 6: cor bone · coronal · 0.37mm/px · 1 of 122 slices shown]
[im 61/122  bone]
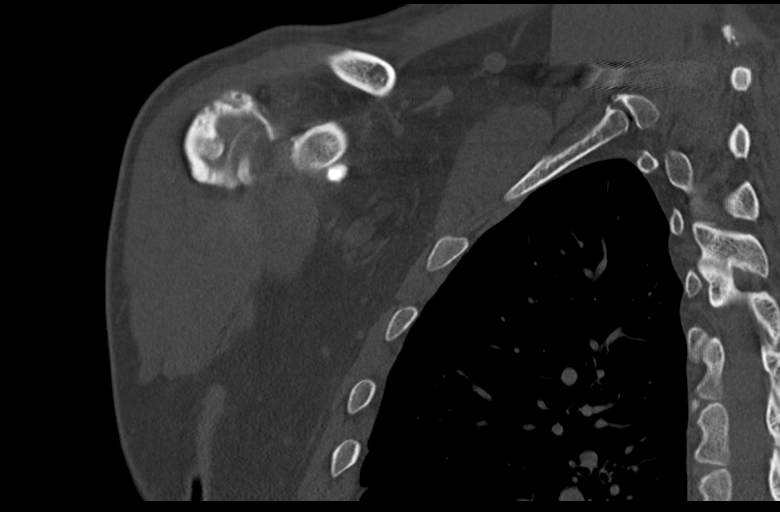

[10 of 20 positions shown; findings below may reference images not displayed]

FINDINGS: Rotator cuff: There is a 27 x 16 mm irregular full-thickness tear of
the distal supraspinous tendon. The tear also involves anterior
fibers of the distal infraspinatus. There is also an intrasubstance
tear of the musculotendinous junction of the infraspinatus.
Subscapularis and teres minor tendons are intact.

Muscles:  Normal.

Long head of the biceps tendon:  Properly located and intact.

Acromioclavicular joint: Slight arthropathy. Type 3 acromion which
could predispose to impingement.

Labrum: There is a tear of the diminutive and degenerated posterior
labrum.

Bones:  Cystic degenerative changes of the greater tuberosity.
IMPRESSION: Large irregular full-thickness tear of the distal supraspinous
tendon also involving the anterior fibers of the distal
infraspinatus tendon.

Intrasubstance tear of the musculotendinous junction of the
infraspinatus.

AC joint arthropathy with a type 3 acromion which could predispose
to impingement.

Tear of the diminutive posterior labrum.

## 2024-08-24 DEATH — deceased
